# Patient Record
Sex: Female | Born: 1990 | Race: Asian | Hispanic: No | Marital: Married | Smoking: Never smoker
Health system: Southern US, Community
[De-identification: ages and names within clinical notes are randomized; demographics above are authoritative.]

## PROBLEM LIST (undated history)

## (undated) ENCOUNTER — Inpatient Hospital Stay (HOSPITAL_COMMUNITY): Payer: Self-pay

## (undated) DIAGNOSIS — J189 Pneumonia, unspecified organism: Secondary | ICD-10-CM

## (undated) DIAGNOSIS — O24419 Gestational diabetes mellitus in pregnancy, unspecified control: Secondary | ICD-10-CM

---
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
## 2010-07-25 ENCOUNTER — Inpatient Hospital Stay (HOSPITAL_COMMUNITY)
Admission: AD | Admit: 2010-07-25 | Discharge: 2010-07-25 | Payer: Self-pay | Source: Home / Self Care | Attending: Obstetrics | Admitting: Obstetrics

## 2010-07-25 LAB — URINE MICROSCOPIC-ADD ON

## 2010-07-25 LAB — URINALYSIS, ROUTINE W REFLEX MICROSCOPIC
Bilirubin Urine: NEGATIVE
Nitrite: NEGATIVE
Protein, ur: NEGATIVE mg/dL
Specific Gravity, Urine: 1.025 (ref 1.005–1.030)
Urine Glucose, Fasting: NEGATIVE mg/dL
Urobilinogen, UA: 0.2 mg/dL (ref 0.0–1.0)
pH: 6.5 (ref 5.0–8.0)

## 2010-07-25 LAB — WET PREP, GENITAL: Yeast Wet Prep HPF POC: NONE SEEN

## 2010-07-25 LAB — CBC
HCT: 29.6 % — ABNORMAL LOW (ref 36.0–46.0)
Platelets: 220 10*3/uL (ref 150–400)

## 2010-07-26 LAB — URINE CULTURE
Colony Count: NO GROWTH
Culture  Setup Time: 201201261536
Culture: NO GROWTH

## 2010-10-17 ENCOUNTER — Emergency Department (HOSPITAL_COMMUNITY)
Admission: EM | Admit: 2010-10-17 | Discharge: 2010-10-17 | Disposition: A | Discharge status: Home / Self Care | Payer: 59 | Attending: Emergency Medicine | Admitting: Emergency Medicine

## 2010-10-17 DIAGNOSIS — R197 Diarrhea, unspecified: Secondary | ICD-10-CM | POA: Insufficient documentation

## 2010-10-17 DIAGNOSIS — R112 Nausea with vomiting, unspecified: Secondary | ICD-10-CM | POA: Insufficient documentation

## 2010-10-17 DIAGNOSIS — K117 Disturbances of salivary secretion: Secondary | ICD-10-CM | POA: Insufficient documentation

## 2010-10-17 DIAGNOSIS — N390 Urinary tract infection, site not specified: Secondary | ICD-10-CM | POA: Insufficient documentation

## 2010-10-17 DIAGNOSIS — R42 Dizziness and giddiness: Secondary | ICD-10-CM | POA: Insufficient documentation

## 2010-10-17 DIAGNOSIS — R5381 Other malaise: Secondary | ICD-10-CM | POA: Insufficient documentation

## 2010-10-17 DIAGNOSIS — O2390 Unspecified genitourinary tract infection in pregnancy, unspecified trimester: Secondary | ICD-10-CM | POA: Insufficient documentation

## 2010-10-17 DIAGNOSIS — K5289 Other specified noninfective gastroenteritis and colitis: Secondary | ICD-10-CM | POA: Insufficient documentation

## 2010-10-17 DIAGNOSIS — O99891 Other specified diseases and conditions complicating pregnancy: Secondary | ICD-10-CM | POA: Insufficient documentation

## 2010-10-17 LAB — URINE MICROSCOPIC-ADD ON

## 2010-10-17 LAB — URINALYSIS, ROUTINE W REFLEX MICROSCOPIC
Bilirubin Urine: NEGATIVE
Glucose, UA: NEGATIVE mg/dL
Nitrite: NEGATIVE
Protein, ur: NEGATIVE mg/dL
Specific Gravity, Urine: 1.027 (ref 1.005–1.030)
Urobilinogen, UA: 0.2 mg/dL (ref 0.0–1.0)
pH: 6 (ref 5.0–8.0)

## 2010-10-18 LAB — URINE CULTURE

## 2010-11-12 NOTE — Discharge Summary (Signed)
[name], [name]                   ACCOUNT NO.:  [id number]   MEDICAL RECORD NO.:  [id number]          PATIENT TYPE:  INP   LOCATION:  6148                         FACILITY:  [facility]   PHYSICIAN:  Henrietta Hoover, MD    DATE OF BIRTH:  24-Oct-1990   DATE OF ADMISSION:  12/12/2007  DATE OF DISCHARGE:  12/13/2007                               DISCHARGE SUMMARY   DISCHARGE PHYSICIAN:  Henrietta Hoover, MD   REASON FOR HOSPITALIZATION:  Medication overdose.   SIGNIFICANT FINDINGS:  Gane is a 20 year old female who presented with  headache and reported that she took a Tylenol, B12, Skelaxin, Nagantec,  possibly INH, and other medications.  She was monitored overnight.  White blood cell count was 17.6, H and H was 11.5 and 32.3, platelets  were 257, MCV was 75.7, and RDW 13.1.  Chem panel showed a sodium of  136, potassium 3.8, chloride 115, CO2 of 17, BUN 5, creatinine 0.55, and  glucose 116.  Total bilirubin 0.9, total protein 6.1, AST was 16, ALT  12, albumin was 2.7, alk phos was 54, and calcium was 8.6.  EKG was  normal. Urine toxicology was negative. Salicylate level was normal.  Alcohol was negative.  Urine pregnancy was negative.  Tylenol level,  which was 74.2 at admission (12 hours post-ingestion), was down to 47.7  at 24 hours and therefore in the low risk zone.  At admission; social  work and psychology were consulted.  The patient was seen by psychology  prior to discharge and did not have suicidal ideation or a plan.  Psychology, in consulation with Lahn's family, felt that an inpatient  psychiatric admission was not needed.   TREATMENT:  Included IV fluids and Narcan.  The patient was briefly on  Mucomyst, but it was discontinued once her Tylenol was in the low risk  range.   OPERATIONS AND PROCEDURES:  None.   FINAL DIAGNOSIS:  Intentional Poly-pharmaceutcal overdose.   DISCHARGE MEDICATIONS:  Multivitamin.   PENDING RESULTS OR ISSUES TO BE FOLLOWED UP:  None.   FOLLOWUP APPOINTMENT:  Marisue Ivan, MD, [facility], phone number is [phone].  Date of appointment December 21, 2007,  at 2:50 p.m.   DISCHARGE WEIGHT:  46.823 kg.   DISCHARGE CONDITION:  Improved and stable.      Pediatrics Resident      Henrietta Hoover, MD  Electronically Signed    PR/MEDQ  D:  12/13/2007  T:  12/14/2007  Job:  [id number]

## 2010-11-22 ENCOUNTER — Inpatient Hospital Stay (HOSPITAL_COMMUNITY)
Admission: EM | Admit: 2010-11-22 | Discharge: 2010-11-25 | DRG: 775 | Disposition: A | Discharge status: Home / Self Care | Payer: 59 | Source: Ambulatory Visit | Attending: Obstetrics | Admitting: Obstetrics

## 2010-11-22 LAB — URINALYSIS, ROUTINE W REFLEX MICROSCOPIC
Bilirubin Urine: NEGATIVE
Specific Gravity, Urine: 1.015 (ref 1.005–1.030)
pH: 7 (ref 5.0–8.0)

## 2010-11-22 LAB — CBC
RBC: 4.77 MIL/uL (ref 3.87–5.11)
WBC: 11.6 10*3/uL — ABNORMAL HIGH (ref 4.0–10.5)

## 2010-11-22 LAB — URINE MICROSCOPIC-ADD ON

## 2010-11-22 LAB — RPR: RPR Ser Ql: NONREACTIVE

## 2010-11-23 ENCOUNTER — Other Ambulatory Visit: Payer: Self-pay | Admitting: Obstetrics & Gynecology

## 2010-11-24 LAB — CBC
Hemoglobin: 11.5 g/dL — ABNORMAL LOW (ref 12.0–15.0)
MCHC: 35.6 g/dL (ref 30.0–36.0)
RBC: 4.34 MIL/uL (ref 3.87–5.11)

## 2010-11-27 NOTE — H&P (Signed)
  [name], [name]                    ACCOUNT NO.:  [id number]  MEDICAL RECORD NO.:  [id number]           PATIENT TYPE:  I  LOCATION:  9161                          FACILITY:  [facility]  PHYSICIAN:  Roseanna Rainbow, M.D.DATE OF BIRTH:  04/30/1991  DATE OF ADMISSION:  11/22/2010 DATE OF DISCHARGE:                             HISTORY & PHYSICAL   CHIEF COMPLAINT:  The patient is a 20 year old para 0 with an estimated date of confinement of December 23, 2010, with an intrauterine pregnancy at 35+ weeks, complaining of contractions.  HISTORY OF PRESENT ILLNESS:  Please see the above.  ALLERGIES:  No known drug allergies.  MEDICATIONS:  Prenatal vitamins, Tylenol.  PRENATAL LABORATORY DATA:  Chlamydia probe negative.  Urine culture and sensitivity, no growth.  Pap smear negative.  GC probe negative.  Two- hour GTT normal.  Hepatitis B surface antigen negative.  Hematocrit 33.4, hemoglobin 11.2, HIV nonreactive, platelets 230,000.  Blood type is AB positive, antibody screen negative, RPR nonreactive, rubella immune.  Sickle cell negative.  PAST GYNECOLOGICAL HISTORY:  She denies.  PAST MEDICAL HISTORY:  Migraine headaches.  PAST SURGICAL HISTORY:  No previous surgeries.  SOCIAL HISTORY:  She is single, living with a significant other.  Does not give any significant history of alcohol usage, has no significant smoking history.  Denies illicit drug use.  FAMILY HISTORY:  Remarkable for hypertension.  REVIEW OF SYSTEMS:  GU:  Please see the above.  PHYSICAL EXAMINATION:  VITAL SIGNS:  Stable, afebrile.  Fetal heart tracing baseline 140, moderate long-term variability.  Accelerations present, no decelerations.  Tocodynamometer uterine contractions every 2- 4 minutes.  Sterile vaginal exam, per the mid-level provider, the cervix is 7 mc dilated with bulging bag of water.  Informal bedside ultrasound confirms the cephalic presentation.  ASSESSMENT:  Nullipara at 35+ weeks, active  labor, category I fetal heart tracing.  PLAN:  Admission.  Penicillin for GBS prophylaxis.  Monitor progress. Anticipate a spontaneous vaginal delivery.     Roseanna Rainbow, M.D.     [name]  D:  11/22/2010  T:  11/23/2010  Job:  [id number]  Electronically Signed by Antionette Char M.D. on 11/27/2010 09:48:09 PM

## 2011-03-27 LAB — POCT PREGNANCY, URINE
Operator id: 294511
Preg Test, Ur: NEGATIVE

## 2011-03-27 LAB — CBC
HCT: 32.3 — ABNORMAL LOW
Hemoglobin: 11.5 — ABNORMAL LOW
Hemoglobin: 13.4
MCHC: 35.2
MCHC: 35.5
MCV: 75.7 — ABNORMAL LOW
RBC: 4.27
RBC: 5.03
RDW: 13.1
WBC: 12.8

## 2011-03-27 LAB — DIFFERENTIAL
Basophils Relative: 1
Lymphs Abs: 3.3
Monocytes Absolute: 0.7
Monocytes Relative: 6
Neutro Abs: 8
Neutrophils Relative %: 63

## 2011-03-27 LAB — RAPID URINE DRUG SCREEN, HOSP PERFORMED
Amphetamines: NOT DETECTED
Barbiturates: NOT DETECTED
Benzodiazepines: NOT DETECTED
Tetrahydrocannabinol: NOT DETECTED

## 2011-03-27 LAB — ETHANOL: Alcohol, Ethyl (B): <5

## 2011-03-27 LAB — COMPREHENSIVE METABOLIC PANEL
Albumin: 3.7
Alkaline Phosphatase: 54
BUN: 5 — ABNORMAL LOW
Chloride: 115 — ABNORMAL HIGH
Creatinine, Ser: 0.55
Glucose, Bld: 116 — ABNORMAL HIGH
Total Bilirubin: 0.9
Total Protein: 6.1

## 2011-03-27 LAB — ACETAMINOPHEN LEVEL
Acetaminophen (Tylenol), Serum: 47.7 — ABNORMAL HIGH
Acetaminophen (Tylenol), Serum: 74.2 — ABNORMAL HIGH

## 2011-03-27 LAB — POCT I-STAT, CHEM 8
Calcium, Ion: 1.22
Glucose, Bld: 97
HCT: 37
TCO2: 17

## 2011-03-27 LAB — TRICYCLICS SCREEN, URINE: TCA Scrn: NOT DETECTED

## 2011-03-27 LAB — SALICYLATE LEVEL: Salicylate Lvl: 14.9

## 2011-03-31 LAB — POCT I-STAT, CHEM 8
BUN: 9
Calcium, Ion: 1.19
Chloride: 108
Creatinine, Ser: 0.8
Glucose, Bld: 81

## 2011-03-31 LAB — URINALYSIS, ROUTINE W REFLEX MICROSCOPIC
Bilirubin Urine: NEGATIVE
Glucose, UA: NEGATIVE
Glucose, UA: NEGATIVE
Hgb urine dipstick: NEGATIVE
Ketones, ur: NEGATIVE
Ketones, ur: 40 — AB
Protein, ur: NEGATIVE
pH: 6
pH: 6

## 2011-03-31 LAB — COMPREHENSIVE METABOLIC PANEL
ALT: 12
AST: 14
AST: 41 — ABNORMAL HIGH
Albumin: 4.7
CO2: 26
Calcium: 9.3
Calcium: 9.7
Chloride: 107
Creatinine, Ser: 0.58
Creatinine, Ser: 0.66
Glucose, Bld: 85
Sodium: 137
Total Bilirubin: 1.6 — ABNORMAL HIGH
Total Protein: 7.5

## 2011-03-31 LAB — DIFFERENTIAL
Basophils Absolute: 0.1
Eosinophils Absolute: 0.6
Eosinophils Relative: 7 — ABNORMAL HIGH
Eosinophils Relative: 1
Lymphocytes Relative: 21 — ABNORMAL LOW
Lymphocytes Relative: 29
Lymphs Abs: 3.2
Monocytes Absolute: 0.7
Monocytes Relative: 6
Neutrophils Relative %: 67

## 2011-03-31 LAB — CBC
Hemoglobin: 13.4
MCHC: 36
MCHC: 34.5
MCV: 75.9 — ABNORMAL LOW
MCV: 76.6 — ABNORMAL LOW
Platelets: 364
RBC: 4.93
WBC: 8.6

## 2011-03-31 LAB — ACETAMINOPHEN LEVEL: Acetaminophen (Tylenol), Serum: <10 — ABNORMAL LOW

## 2011-03-31 LAB — RAPID URINE DRUG SCREEN, HOSP PERFORMED
Barbiturates: NOT DETECTED
Benzodiazepines: NOT DETECTED
Cocaine: NOT DETECTED

## 2011-03-31 LAB — LIPASE, BLOOD: Lipase: 36

## 2011-03-31 LAB — TRICYCLICS SCREEN, URINE: TCA Scrn: NOT DETECTED

## 2011-03-31 LAB — SALICYLATE LEVEL: Salicylate Lvl: <4

## 2011-04-01 LAB — BASIC METABOLIC PANEL
BUN: 13
CO2: 26
Chloride: 107
Glucose, Bld: 70
Potassium: 3.9
Sodium: 140

## 2011-04-01 LAB — CBC
HCT: 39.2
Hemoglobin: 13.7
MCHC: 35
MCV: 76.2 — ABNORMAL LOW
Platelets: 289
RDW: 12.8

## 2011-04-01 LAB — URINE CULTURE
Colony Count: NO GROWTH
Culture: NO GROWTH

## 2011-04-01 LAB — DIFFERENTIAL
Basophils Absolute: 0
Eosinophils Absolute: 0.5
Eosinophils Relative: 4
Monocytes Absolute: 0.4

## 2011-04-01 LAB — URINALYSIS, ROUTINE W REFLEX MICROSCOPIC
Bilirubin Urine: NEGATIVE
Ketones, ur: 15 — AB
Nitrite: NEGATIVE
Protein, ur: NEGATIVE

## 2011-04-01 LAB — URINE MICROSCOPIC-ADD ON

## 2011-07-05 ENCOUNTER — Encounter: Payer: Self-pay | Admitting: Emergency Medicine

## 2011-07-05 ENCOUNTER — Emergency Department (INDEPENDENT_AMBULATORY_CARE_PROVIDER_SITE_OTHER)
Admission: EM | Admit: 2011-07-05 | Discharge: 2011-07-05 | Disposition: A | Discharge status: Home / Self Care | Payer: 59 | Source: Home / Self Care | Attending: Family Medicine | Admitting: Family Medicine

## 2011-07-05 DIAGNOSIS — J069 Acute upper respiratory infection, unspecified: Secondary | ICD-10-CM

## 2011-07-05 MED ORDER — AZITHROMYCIN 250 MG PO TABS: 250.0000 mg | ORAL_TABLET | Freq: Every day | ORAL | Status: AC

## 2011-07-05 MED ORDER — GUAIFENESIN-CODEINE 100-10 MG/5ML PO SYRP: ORAL_SOLUTION | ORAL | Status: DC

## 2011-07-05 NOTE — ED Provider Notes (Signed)
History     CSN: [id number]  Arrival date & time 07/05/11  1158   First MD Initiated Contact with Patient 07/05/11 1253      Chief Complaint  Patient presents with  . Cough    (Consider location/radiation/quality/duration/timing/severity/associated sxs/prior treatment) Patient is a 21 y.o. female presenting with cough. The history is provided by the patient. The history is limited by a language barrier. A language interpreter was used.  Cough This is a new problem. The current episode started more than 1 week ago. The problem occurs constantly. The problem has not changed since onset.The cough is non-productive. There has been no fever. Associated symptoms include headaches, rhinorrhea, sore throat and myalgias. Pertinent negatives include no ear pain, no shortness of breath and no wheezing. She has tried decongestants for the symptoms. The treatment provided no relief. She is not a smoker.    History reviewed. No pertinent past medical history.  History reviewed. No pertinent past surgical history.  History reviewed. No pertinent family history.  History  Substance Use Topics  . Smoking status: Never Smoker   . Smokeless tobacco: Not on file  . Alcohol Use: No    OB History    Grav Para Term Preterm Abortions TAB SAB Ect Mult Living                  Review of Systems  Constitutional: Negative.   HENT: Positive for congestion, sore throat and rhinorrhea. Negative for ear pain, trouble swallowing, neck pain, neck stiffness, postnasal drip and sinus pressure.   Respiratory: Positive for cough. Negative for chest tightness, shortness of breath and wheezing.   Gastrointestinal: Negative.   Genitourinary: Negative.   Musculoskeletal: Positive for myalgias.  Skin: Negative.   Neurological: Positive for headaches.    Allergies  Review of patient's allergies indicates no known allergies.  Home Medications   Current Outpatient Rx  Name Route Sig Dispense Refill  .  AZITHROMYCIN 250 MG PO TABS Oral Take 1 tablet (250 mg total) by mouth daily. Take first 2 tablets together, then 1 every day until finished. 6 tablet 0  . GUAIFENESIN-CODEINE 100-10 MG/5ML PO SYRP  Take 1-2 tsp po q 6 hrs prn cough 180 mL 0    BP 113/77  Pulse 100  Temp(Src) 98.6 F (37 C) (Oral)  Resp 20  SpO2 99%  LMP 06/28/2011  Physical Exam  Nursing note and vitals reviewed. Constitutional: She appears well-developed and well-nourished. No distress.  HENT:  Head: Normocephalic and atraumatic.  Right Ear: Tympanic membrane normal.  Left Ear: Tympanic membrane normal.  Nose: Nose normal.  Mouth/Throat: Uvula is midline and oropharynx is clear and moist.  Neck: Normal range of motion. Neck supple.  Cardiovascular: Normal rate, regular rhythm and normal heart sounds.   Pulmonary/Chest: Effort normal and breath sounds normal. No respiratory distress. She has no wheezes.       Constant congested cough  Lymphadenopathy:    She has no cervical adenopathy.  Skin: Skin is dry.    ED Course  Procedures (including critical care time)  Labs Reviewed - No data to display No results found.   1. URI (upper respiratory infection)       MDM          Randa Spike, MD 07/05/11 [phone]

## 2011-07-05 NOTE — ED Notes (Signed)
C/o cough, runny nose, headache, dizziness.---denies vomiting, just not eating, poor appetite.  Right side of head is very painful.

## 2013-03-06 ENCOUNTER — Emergency Department (HOSPITAL_COMMUNITY)
Admission: EM | Admit: 2013-03-06 | Discharge: 2013-03-07 | Disposition: A | Discharge status: Home / Self Care | Payer: 59 | Attending: Emergency Medicine | Admitting: Emergency Medicine

## 2013-03-06 ENCOUNTER — Encounter (HOSPITAL_COMMUNITY): Payer: Self-pay | Admitting: *Deleted

## 2013-03-06 ENCOUNTER — Emergency Department (HOSPITAL_COMMUNITY): Payer: 59

## 2013-03-06 DIAGNOSIS — N760 Acute vaginitis: Secondary | ICD-10-CM | POA: Insufficient documentation

## 2013-03-06 DIAGNOSIS — B9689 Other specified bacterial agents as the cause of diseases classified elsewhere: Secondary | ICD-10-CM | POA: Insufficient documentation

## 2013-03-06 DIAGNOSIS — O200 Threatened abortion: Secondary | ICD-10-CM

## 2013-03-06 DIAGNOSIS — O039 Complete or unspecified spontaneous abortion without complication: Secondary | ICD-10-CM | POA: Insufficient documentation

## 2013-03-06 DIAGNOSIS — A499 Bacterial infection, unspecified: Secondary | ICD-10-CM | POA: Insufficient documentation

## 2013-03-06 LAB — URINALYSIS, ROUTINE W REFLEX MICROSCOPIC
Bilirubin Urine: NEGATIVE
Hgb urine dipstick: NEGATIVE
Nitrite: NEGATIVE
Protein, ur: NEGATIVE mg/dL
Specific Gravity, Urine: 1.02 (ref 1.005–1.030)
Urobilinogen, UA: 1 mg/dL (ref 0.0–1.0)

## 2013-03-06 LAB — CBC WITH DIFFERENTIAL/PLATELET
Basophils Absolute: 0.1 10*3/uL (ref 0.0–0.1)
Basophils Relative: 0 % (ref 0–1)
Eosinophils Absolute: 0.8 10*3/uL — ABNORMAL HIGH (ref 0.0–0.7)
MCH: 26.3 pg (ref 26.0–34.0)
MCHC: 36.8 g/dL — ABNORMAL HIGH (ref 30.0–36.0)
Neutro Abs: 9.6 10*3/uL — ABNORMAL HIGH (ref 1.7–7.7)
Neutrophils Relative %: 65 % (ref 43–77)
Platelets: 252 10*3/uL (ref 150–400)
RDW: 13.4 % (ref 11.5–15.5)

## 2013-03-06 LAB — TYPE AND SCREEN: Antibody Screen: NEGATIVE

## 2013-03-06 LAB — COMPREHENSIVE METABOLIC PANEL
ALT: 7 U/L (ref 0–35)
AST: 12 U/L (ref 0–37)
Albumin: 3.8 g/dL (ref 3.5–5.2)
Alkaline Phosphatase: 33 U/L — ABNORMAL LOW (ref 39–117)
BUN: 10 mg/dL (ref 6–23)
Chloride: 101 mEq/L (ref 96–112)
Potassium: 4.1 mEq/L (ref 3.5–5.1)
Sodium: 134 mEq/L — ABNORMAL LOW (ref 135–145)
Total Protein: 7 g/dL (ref 6.0–8.3)

## 2013-03-06 LAB — URINE MICROSCOPIC-ADD ON

## 2013-03-06 NOTE — ED Notes (Addendum)
Pt states that she took a home pregnancy test 2 months ago and it came back positive. Pt states that today she wiped after using the bathroom and noticed blood. Pt states she is bleeding per normal period she would regularly have. Pt worried about miscarriage. Pt states back pain and abdominal pain since Friday.

## 2013-03-06 NOTE — ED Notes (Signed)
Patient transported to ultrasound.

## 2013-03-06 NOTE — ED Provider Notes (Signed)
CSN: [id number]     Arrival date & time 03/06/13  2057 History   First MD Initiated Contact with Patient 03/06/13 2147     Chief Complaint  Patient presents with  . Vaginal Bleeding    possibly 2 months pregnant   (Consider location/radiation/quality/duration/timing/severity/associated sxs/prior Treatment) Patient is a 22 y.o. female presenting with vaginal bleeding. The history is provided by the patient and medical records.  Vaginal Bleeding Associated symptoms: abdominal pain    Patient presents to the ED for abdominal pain and vaginal bleeding. Patient is G2 P1, approximately [redacted] weeks gestation who noticed a small amount of vaginal bleeding earlier today after urinating. Patient states this amount of blood resembled her normal period, no clots. Abdominal pain described as cramping in nature. No associated nausea, vomiting, or diarrhea.  Admits to some dysuria 1 week ago, none at present.  No vaginal discharge.  No new sexual partners or concern for STD.  Patient has not received any prenatal care at this time.  History reviewed. No pertinent past medical history. History reviewed. No pertinent past surgical history. History reviewed. No pertinent family history. History  Substance Use Topics  . Smoking status: Never Smoker   . Smokeless tobacco: Not on file  . Alcohol Use: No   OB History   Grav Para Term Preterm Abortions TAB SAB Ect Mult Living                 Review of Systems  Gastrointestinal: Positive for abdominal pain.  Genitourinary: Positive for vaginal bleeding.  All other systems reviewed and are negative.    Allergies  Review of patient's allergies indicates no known allergies.  Home Medications  No current outpatient prescriptions on file. BP 122/80  Pulse 94  Temp(Src) 98.7 F (37.1 C) (Oral)  Resp 16  SpO2 100%  Physical Exam  Nursing note and vitals reviewed. Constitutional: She is oriented to person, place, and time. She appears well-developed and  well-nourished.  HENT:  Head: Normocephalic and atraumatic.  Mouth/Throat: Oropharynx is clear and moist.  Eyes: Conjunctivae and EOM are normal. Pupils are equal, round, and reactive to light.  Neck: Normal range of motion.  Cardiovascular: Normal rate, regular rhythm and normal heart sounds.   Pulmonary/Chest: Effort normal and breath sounds normal.  Abdominal: Soft. Bowel sounds are normal.  Genitourinary: Vagina normal. There is no lesion on the right labia. There is no lesion on the left labia. Cervix exhibits no motion tenderness. Right adnexum displays no tenderness. Left adnexum displays no tenderness. No bleeding around the vagina. No vaginal discharge found.  Cervical os closed, no vaginal bleeding; scant purulent vaginal d/c; no adnexal or CMT  Musculoskeletal: Normal range of motion.  Neurological: She is alert and oriented to person, place, and time.  Skin: Skin is warm and dry.  Psychiatric: She has a normal mood and affect.    ED Course  Procedures (including critical care time) Labs Review Labs Reviewed  URINALYSIS, ROUTINE W REFLEX MICROSCOPIC - Abnormal; Notable for the following:    Leukocytes, UA SMALL (*)    All other components within normal limits  URINE MICROSCOPIC-ADD ON - Abnormal; Notable for the following:    Squamous Epithelial / LPF FEW (*)    All other components within normal limits  CBC WITH DIFFERENTIAL - Abnormal; Notable for the following:    WBC 14.8 (*)    Hemoglobin 11.7 (*)    HCT 31.8 (*)    MCV 71.5 (*)    MCHC  36.8 (*)    Neutro Abs 9.6 (*)    Monocytes Absolute 1.1 (*)    Eosinophils Absolute 0.8 (*)    All other components within normal limits  COMPREHENSIVE METABOLIC PANEL - Abnormal; Notable for the following:    Sodium 134 (*)    Alkaline Phosphatase 33 (*)    All other components within normal limits  HCG, QUANTITATIVE, PREGNANCY - Abnormal; Notable for the following:    hCG, Beta Chain, Quant, [place] [address] (*)    All other  components within normal limits  POCT PREGNANCY, URINE - Abnormal; Notable for the following:    Preg Test, Ur POSITIVE (*)    All other components within normal limits  URINE CULTURE  WET PREP, GENITAL  GC/CHLAMYDIA PROBE AMP  TYPE AND SCREEN  ABO/RH   Imaging Review US Ob Comp Less 14 Wks  03/06/2013   *RADIOLOGY REPORT*  Clinical Data: Vaginal bleeding, clinical gestational age of [redacted] weeks and 2 days.  OBSTETRIC <14 WK ULTRASOUND, TRANSVAGINAL OB US  Technique:  Transabdominal and transvaginal ultrasound was performed for evaluation of the gestation as well as the maternal uterus and adnexal regions.  Number of gestation: 1 Heart Rate: 187 bpm  CRL:  22.5         8w  6d                  [place] EDC: 10/10/2013  Maternal uterus/adnexae: No subchorionic hemorrhage.  The right ovary appears normal measuring 2 x 1.1 x 2.7 cm.  The left ovary measures 3.6 x 8.3 x 4.3 cm.  Cyst within the left ovary measures 2.7 x 2.7 x 2.8 cm.  No free fluid.  IMPRESSION:  1.  Single living intrauterine gestation without complication. 2.  The estimated gestational age is 8 weeks and 6 days.  The clinical gestational age is 9 weeks and 2 days. 3.  Cyst in left ovary.   Original Report Authenticated By: Signa Kell, M.D.   US Ob Transvaginal  03/06/2013   *RADIOLOGY REPORT*  Clinical Data: Vaginal bleeding, clinical gestational age of [redacted] weeks and 2 days.  OBSTETRIC <14 WK ULTRASOUND, TRANSVAGINAL OB US  Technique:  Transabdominal and transvaginal ultrasound was performed for evaluation of the gestation as well as the maternal uterus and adnexal regions.  Number of gestation: 1 Heart Rate: 187 bpm  CRL:  22.5         8w  6d                  [place] EDC: 10/10/2013  Maternal uterus/adnexae: No subchorionic hemorrhage.  The right ovary appears normal measuring 2 x 1.1 x 2.7 cm.  The left ovary measures 3.6 x 8.3 x 4.3 cm.  Cyst within the left ovary measures 2.7 x 2.7 x 2.8 cm.  No free fluid.  IMPRESSION:  1.  Single living  intrauterine gestation without complication. 2.  The estimated gestational age is 8 weeks and 6 days.  The clinical gestational age is 9 weeks and 2 days. 3.  Cyst in left ovary.   Original Report Authenticated By: Signa Kell, M.D.    MDM   1. Threatened abortion in first trimester   2. Bacterial vaginosis    Vaginal bleeding in first trimester.  Labs as above.  Blood type AB +, Rhogam not indicated.  U/S with single, intrauterine pregnancy estimated at 8 weeks 6 days, no subchorionic hemorrhage.  Quant appropriate for gestational age. Pelvic exam without noted vaginal bleeding.  Wet prep with many clue cells, Gc/Chl pending.  I doubt acute/surgical abdomen at this time.  Pt afebrile, non-toxic appearing, NAD, VS stable- ok for discharge.  Rx flagyl and prenatal vitamins.  Instructed pt to FU with OB-GYN.  Garlon Hatchet, PA-C 03/07/13 [phone]

## 2013-03-07 LAB — WET PREP, GENITAL: Yeast Wet Prep HPF POC: NONE SEEN

## 2013-03-07 LAB — GC/CHLAMYDIA PROBE AMP
CT Probe RNA: NEGATIVE
GC Probe RNA: NEGATIVE

## 2013-03-07 LAB — OB RESULTS CONSOLE GC/CHLAMYDIA
Chlamydia: NEGATIVE
GC PROBE AMP, GENITAL: NEGATIVE

## 2013-03-07 MED ORDER — METRONIDAZOLE 500 MG PO TABS: 500.0000 mg | ORAL_TABLET | Freq: Two times a day (BID) | ORAL | Status: DC

## 2013-03-07 MED ORDER — PRENATAL COMPLETE 14-0.4 MG PO TABS: 1.0000 | ORAL_TABLET | Freq: Every day | ORAL | Status: DC

## 2013-03-07 NOTE — ED Provider Notes (Signed)
Medical screening examination/treatment/procedure(s) were performed by non-physician practitioner and as supervising physician I was immediately available for consultation/collaboration.  Jermone Geister, MD 03/07/13 0106 

## 2013-03-08 LAB — URINE CULTURE: Colony Count: NO GROWTH

## 2013-04-15 ENCOUNTER — Ambulatory Visit (INDEPENDENT_AMBULATORY_CARE_PROVIDER_SITE_OTHER): Payer: 59 | Admitting: Advanced Practice Midwife

## 2013-04-15 ENCOUNTER — Encounter: Payer: Self-pay | Admitting: Advanced Practice Midwife

## 2013-04-15 ENCOUNTER — Other Ambulatory Visit: Payer: Self-pay | Admitting: Advanced Practice Midwife

## 2013-04-15 VITALS — BP 114/73 | Temp 98.1°F | Ht <= 58 in | Wt 110.6 lb

## 2013-04-15 DIAGNOSIS — Z3480 Encounter for supervision of other normal pregnancy, unspecified trimester: Secondary | ICD-10-CM

## 2013-04-15 DIAGNOSIS — Z3689 Encounter for other specified antenatal screening: Secondary | ICD-10-CM

## 2013-04-15 DIAGNOSIS — O09292 Supervision of pregnancy with other poor reproductive or obstetric history, second trimester: Secondary | ICD-10-CM

## 2013-04-15 DIAGNOSIS — Z3201 Encounter for pregnancy test, result positive: Secondary | ICD-10-CM

## 2013-04-15 DIAGNOSIS — O09219 Supervision of pregnancy with history of pre-term labor, unspecified trimester: Secondary | ICD-10-CM

## 2013-04-15 NOTE — Progress Notes (Signed)
Pulse 101 Subjective:    [name] is being seen today for her first obstetrical visit.  This is not a planned pregnancy. She is at [redacted]w[redacted]d gestation. Her obstetrical history is significant for preterm labor and delivery. Relationship with FOB: significant other, living together. Patient does intend to breast feed. Pregnancy history fully reviewed.  Menstrual History: OB History   Grav Para Term Preterm Abortions TAB SAB Ect Mult Living   2 1  1      1       Menarche age: 78  Patient's last menstrual period was 12/31/2012.    The following portions of the patient's history were reviewed and updated as appropriate: allergies, current medications, past family history, past medical history, past social history, past surgical history and problem list.  Review of Systems A comprehensive review of systems was negative.    Objective:    BP 114/73  Temp(Src) 98.1 F (36.7 C)  Ht 4\' 10"  (1.473 m)  Wt 110 lb 9.6 oz (50.168 kg)  BMI 23.12 kg/m2  LMP 12/31/2012  General Appearance:    Alert, cooperative, no distress, appears stated age  Head:    Normocephalic, without obvious abnormality, atraumatic  Eyes:    PERRL, conjunctiva/corneas clear, EOM's intact, fundi    benign, both eyes  Ears:    Normal TM's and external ear canals, both ears  Nose:   Nares normal, septum midline, mucosa normal, no drainage    or sinus tenderness  Throat:   Lips, mucosa, and tongue normal; teeth and gums normal  Neck:   Supple, symmetrical, trachea midline, no adenopathy;    thyroid:  no enlargement/tenderness/nodules; no carotid   bruit or JVD  Back:     Symmetric, no curvature, ROM normal, no CVA tenderness  Lungs:     Clear to auscultation bilaterally, respirations unlabored  Chest Wall:    No tenderness or deformity   Heart:    Regular rate and rhythm, S1 and S2 normal, no murmur, rub   or gallop  Breast Exam:    No tenderness, masses, or nipple abnormality  Abdomen:     Soft, non-tender, bowel sounds  active all four quadrants,    no masses, no organomegaly  Genitalia:    Normal female without lesion, discharge or tenderness  Rectal:    Normal tone, normal prostate, no masses or tenderness;   guaiac negative stool  Extremities:   Extremities normal, atraumatic, no cyanosis or edema  Pulses:   2+ and symmetric all extremities  Skin:   Skin color, texture, turgor normal, no rashes or lesions  Lymph nodes:   Cervical, supraclavicular, and axillary nodes normal  Neurologic:   CNII-XII intact, normal strength, sensation and reflexes    throughout      Assessment:    Pregnancy at [redacted]w[redacted]d weeks  Patient Active Problem List   Diagnosis Date Noted  . H/O preterm delivery, currently pregnant 04/15/2013  . 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 Prenatal vitamins.  Counseling provided regarding continued use of seat belts, cessation of alcohol consumption, smoking or use of illicit drugs; infection precautions i.e., influenza/TDAP immunizations, toxoplasmosis,CMV, parvovirus, listeria and varicella; workplace safety, exercise during pregnancy; routine dental care, safe medications, sexual activity, hot tubs, saunas, pools, travel, caffeine use, fish and methlymercury, potential toxins, hair treatments, varicose veins Weight gain recommendations reviewed: underweight/BMI< 18.5--> gain 28 - 40 lbs; normal weight/BMI 18.5 - 24.9--> gain 25 - 35 lbs; overweight/BMI  25 - 29.9--> gain 15 - 25 lbs; obese/BMI >30->gain  11 - 20 lbs Problem list reviewed and updated. AFP3 discussed: requested. Role of ultrasound in pregnancy discussed; fetal survey: requested. Amniocentesis discussed: not indicated. Follow up in 4 weeks. 90% of 40 min visit spent on counseling and coordination of care.  MFM consult for history of Preterm delivery.  Lavanna Rog Wilson Singer CNM

## 2013-04-16 LAB — OBSTETRIC PANEL
Basophils Absolute: 0.1 10*3/uL (ref 0.0–0.1)
Basophils Relative: 1 % (ref 0–1)
Hemoglobin: 12.5 g/dL (ref 12.0–15.0)
Hepatitis B Surface Ag: NEGATIVE
MCHC: 34.8 g/dL (ref 30.0–36.0)
Neutro Abs: 9.6 10*3/uL — ABNORMAL HIGH (ref 1.7–7.7)
Neutrophils Relative %: 69 % (ref 43–77)
RDW: 13.3 % (ref 11.5–15.5)
WBC: 13.6 10*3/uL — ABNORMAL HIGH (ref 4.0–10.5)

## 2013-04-16 LAB — VITAMIN D 25 HYDROXY (VIT D DEFICIENCY, FRACTURES): Vit D, 25-Hydroxy: 18 ng/mL — ABNORMAL LOW (ref 30–89)

## 2013-04-16 LAB — VARICELLA ZOSTER ANTIBODY, IGG: Varicella IgG: 271 Index — ABNORMAL HIGH (ref <135.00)

## 2013-04-16 LAB — HIV ANTIBODY (ROUTINE TESTING W REFLEX): HIV: NONREACTIVE

## 2013-04-18 LAB — PAP IG, CT-NG, RFX HPV ASCU
Chlamydia Probe Amp: NEGATIVE
GC Probe Amp: NEGATIVE

## 2013-04-19 LAB — HEMOGLOBINOPATHY EVALUATION
Hgb F Quant: 3.9 % — ABNORMAL HIGH (ref 0.0–2.0)
Hgb S Quant: 0 %

## 2013-04-20 LAB — AFP, QUAD SCREEN
AFP: 19.6 IU/mL
Age Alone: 1:1140 {titer}
Curr Gest Age: 14.4 wks.days
Down Syndrome Scr Risk Est: 1:25800 {titer}
HCG, Total: 29062 m[IU]/mL
INH: 97.8 pg/mL
MoM for INH: 0.46
MoM for hCG: 0.77
Open Spina bifida: NEGATIVE
Osb Risk: <1:27300 {titer}
Trisomy 18 (Edward) Syndrome Interp.: 1:26400 {titer}

## 2013-04-22 LAB — HGB ELECTROPHORESIS REFLEXED REPORT
Hemoglobin A - HGBRFX: 67.1 % — ABNORMAL LOW (ref >96.0)
Hemoglobin E: 26.2 % — ABNORMAL HIGH
Hemoglobin F - HGBRFX: 3.6 % — ABNORMAL HIGH (ref <2.0)

## 2013-04-25 ENCOUNTER — Encounter: Payer: Self-pay | Admitting: Advanced Practice Midwife

## 2013-04-25 DIAGNOSIS — D582 Other hemoglobinopathies: Secondary | ICD-10-CM | POA: Insufficient documentation

## 2013-04-25 DIAGNOSIS — E559 Vitamin D deficiency, unspecified: Secondary | ICD-10-CM | POA: Insufficient documentation

## 2013-05-06 ENCOUNTER — Other Ambulatory Visit (HOSPITAL_COMMUNITY): Payer: 59

## 2013-05-10 ENCOUNTER — Ambulatory Visit (HOSPITAL_COMMUNITY): Admission: RE | Admit: 2013-05-10 | Payer: 59 | Source: Ambulatory Visit

## 2013-05-10 ENCOUNTER — Ambulatory Visit (HOSPITAL_COMMUNITY): Payer: 59 | Attending: Advanced Practice Midwife

## 2013-05-13 ENCOUNTER — Ambulatory Visit (INDEPENDENT_AMBULATORY_CARE_PROVIDER_SITE_OTHER): Payer: 59 | Admitting: Advanced Practice Midwife

## 2013-05-13 VITALS — BP 106/71 | Temp 98.2°F | Wt 115.8 lb

## 2013-05-13 DIAGNOSIS — E559 Vitamin D deficiency, unspecified: Secondary | ICD-10-CM

## 2013-05-13 DIAGNOSIS — Z3480 Encounter for supervision of other normal pregnancy, unspecified trimester: Secondary | ICD-10-CM

## 2013-05-13 DIAGNOSIS — Z3482 Encounter for supervision of other normal pregnancy, second trimester: Secondary | ICD-10-CM

## 2013-05-13 LAB — POCT URINALYSIS DIPSTICK
Bilirubin, UA: NEGATIVE
Glucose, UA: NEGATIVE
Ketones, UA: NEGATIVE
Nitrite, UA: NEGATIVE

## 2013-05-13 MED ORDER — OB COMPLETE PETITE 35-5-1-200 MG PO CAPS: 1.0000 | ORAL_CAPSULE | Freq: Every day | ORAL | Status: DC

## 2013-05-13 NOTE — Progress Notes (Signed)
Pulse = 112 

## 2013-05-13 NOTE — Progress Notes (Signed)
Routine Obstetrical Visit  Subjective:    [name] is being seen today for her routine obstetrical visit. She is at [redacted]w[redacted]d gestation.   Patient not aware of [place] and MFM appt.  Patient reports no complaints.   Objective:     BP 106/71  Temp(Src) 98.2 F (36.8 C)  Wt 115 lb 12.8 oz (52.527 kg)  LMP 12/31/2012 Physical Exam  Exam FHR 150 FH @ U   Assessment:    Pregnancy: G2P0101 Patient Active Problem List   Diagnosis Date Noted  . 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 Unspecified vitamin D deficiency 04/25/2013  . H/O preterm delivery, currently pregnant 04/15/2013  . 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 Problem list reviewed and updated. MFM [place] and referral rescheduled, patient missed 1st appt was not aware of it.  Follow up in 4 weeks. GCT plan after next visit or do 1 hour gct. Gave patient PNV w/ Vitamin D  80% of 20 min visit spent on counseling and coordination of care.     Janee Ureste 05/13/2013

## 2013-05-24 ENCOUNTER — Ambulatory Visit (HOSPITAL_COMMUNITY)
Admission: RE | Admit: 2013-05-24 | Discharge: 2013-05-24 | Disposition: A | Discharge status: Home / Self Care | Payer: 59 | Source: Ambulatory Visit | Attending: Advanced Practice Midwife | Admitting: Advanced Practice Midwife

## 2013-05-24 ENCOUNTER — Encounter (HOSPITAL_COMMUNITY): Payer: Self-pay

## 2013-05-24 VITALS — BP 120/70 | HR 110 | Wt 116.0 lb

## 2013-05-24 DIAGNOSIS — Z1389 Encounter for screening for other disorder: Secondary | ICD-10-CM | POA: Insufficient documentation

## 2013-05-24 DIAGNOSIS — E559 Vitamin D deficiency, unspecified: Secondary | ICD-10-CM

## 2013-05-24 DIAGNOSIS — O358XX0 Maternal care for other (suspected) fetal abnormality and damage, not applicable or unspecified: Secondary | ICD-10-CM | POA: Diagnosis present

## 2013-05-24 DIAGNOSIS — Z363 Encounter for antenatal screening for malformations: Secondary | ICD-10-CM | POA: Insufficient documentation

## 2013-05-24 DIAGNOSIS — O09292 Supervision of pregnancy with other poor reproductive or obstetric history, second trimester: Secondary | ICD-10-CM

## 2013-05-24 DIAGNOSIS — Z8751 Personal history of pre-term labor: Secondary | ICD-10-CM | POA: Diagnosis not present

## 2013-05-24 DIAGNOSIS — D582 Other hemoglobinopathies: Secondary | ICD-10-CM

## 2013-05-24 DIAGNOSIS — Z3689 Encounter for other specified antenatal screening: Secondary | ICD-10-CM

## 2013-05-24 NOTE — ED Notes (Signed)
Patient unsure of fetal movement.

## 2013-05-24 NOTE — Consult Note (Signed)
Maternal Fetal Medicine Consultation  Requesting Provider(s): Amy Dessa Phi, CNM  Reason for consultation: History of preterm delivery at [redacted] weeks gestation  HPI: [name] is a 22 yo G2P0101 currently at 69 4/7 weeks who was seen for consultation due to history of a previous spontaneous preterm delivery at 35 weeks.  She reports that she was admitted on several occasions during that pregnancy for preterm contractions.  She denies PROM with that pregnancy.  Her prenatal course has otherwise been uncomplicated.  She is without complaints today.  OB History: OB History   Grav Para Term Preterm Abortions TAB SAB Ect Mult Living   2 1  1      1       PMH: Hemoglobin E trait (benign)  PSH:  Past Surgical History  Procedure Laterality Date  . Vaginal delivery     Meds: Prenatal vitamins  Allergies: No Known Allergies  FH: Hypertension - mother; denies family history of birth defects or hereditary disorders  Soc: denies tobacco, ETOH or illicit drug use  Review of Systems: no vaginal bleeding or cramping/contractions, no LOF, no nausea/vomiting. All other systems reviewed and are negative.  PE:   Filed Vitals:   05/24/13 1319  BP: 120/70  Pulse: 110   Please see separate document for fetal ultrasound report.   A/P: 1) Single IUP at 20 4/7 weeks         2) Hx of previous preterm delivery at 35-36 weeks - based on this information, the patient is a candidate for 17-P injections.  This intervention my decrease the risk of recurrent preterm delivery at ~ 30%.  In general, would start medications at 16-20 weeks (or as close to 20 weeks as possible) and continue weekly injections until [redacted] weeks gestation.  Questions were answered to the patient's satisfaction - but she is still somewhat hesitant.  She will discuss further at her next OB follow up.   Thank you for the opportunity to be a part of the care of [name]. Please contact our office if we can be of further assistance.    I spent approximately 15 minutes with this patient with over 50% of time spent in face-to-face counseling.  Alpha Gula, MD Maternal-Fetal Medicine

## 2013-05-25 ENCOUNTER — Encounter: Payer: Self-pay | Admitting: Advanced Practice Midwife

## 2013-05-25 LAB — US OB DETAIL + 14 WK

## 2013-06-10 ENCOUNTER — Encounter: Payer: 59 | Admitting: Advanced Practice Midwife

## 2013-06-10 ENCOUNTER — Ambulatory Visit (INDEPENDENT_AMBULATORY_CARE_PROVIDER_SITE_OTHER): Payer: 59 | Admitting: Obstetrics & Gynecology

## 2013-06-10 VITALS — BP 125/68 | Temp 98.3°F | Wt 124.0 lb

## 2013-06-10 DIAGNOSIS — Z3480 Encounter for supervision of other normal pregnancy, unspecified trimester: Secondary | ICD-10-CM | POA: Insufficient documentation

## 2013-06-10 DIAGNOSIS — Z3482 Encounter for supervision of other normal pregnancy, second trimester: Secondary | ICD-10-CM

## 2013-06-10 DIAGNOSIS — Z23 Encounter for immunization: Secondary | ICD-10-CM

## 2013-06-10 LAB — POCT URINALYSIS DIPSTICK
Leukocytes, UA: NEGATIVE
Nitrite, UA: NEGATIVE
Protein, UA: NEGATIVE
pH, UA: 6

## 2013-06-10 NOTE — Patient Instructions (Signed)
Second Trimester of Pregnancy The second trimester is from week 13 through week 28, months 4 through 6. The second trimester is often a time when you feel your best. Your body has also adjusted to being pregnant, and you begin to feel better physically. Usually, morning sickness has lessened or quit completely, you may have more energy, and you may have an increase in appetite. The second trimester is also a time when the fetus is growing rapidly. At the end of the sixth month, the fetus is about 9 inches long and weighs about 1 pounds. You will likely begin to feel the baby move (quickening) between 18 and 20 weeks of the pregnancy. BODY CHANGES Your body goes through many changes during pregnancy. The changes vary from woman to woman.   Your weight will continue to increase. You will notice your lower abdomen bulging out.  You may begin to get stretch marks on your hips, abdomen, and breasts.  You may develop headaches that can be relieved by medicines approved by your caregiver.  You may urinate more often because the fetus is pressing on your bladder.  You may develop or continue to have heartburn as a result of your pregnancy.  You may develop constipation because certain hormones are causing the muscles that push waste through your intestines to slow down.  You may develop hemorrhoids or swollen, bulging veins (varicose veins).  You may have back pain because of the weight gain and pregnancy hormones relaxing your joints between the bones in your pelvis and as a result of a shift in weight and the muscles that support your balance.  Your breasts will continue to grow and be tender.  Your gums may bleed and may be sensitive to brushing and flossing.  Dark spots or blotches (chloasma, mask of pregnancy) may develop on your face. This will likely fade after the baby is born.  A dark line from your belly button to the pubic area (linea nigra) may appear. This will likely fade after the  baby is born. WHAT TO EXPECT AT YOUR PRENATAL VISITS During a routine prenatal visit:  You will be weighed to make sure you and the fetus are growing normally.  Your blood pressure will be taken.  Your abdomen will be measured to track your baby's growth.  The fetal heartbeat will be listened to.  Any test results from the previous visit will be discussed. Your caregiver may ask you:  How you are feeling.  If you are feeling the baby move.  If you have had any abnormal symptoms, such as leaking fluid, bleeding, severe headaches, or abdominal cramping.  If you have any questions. Other tests that may be performed during your second trimester include:  Blood tests that check for:  Low iron levels (anemia).  Gestational diabetes (between 24 and 28 weeks).  Rh antibodies.  Urine tests to check for infections, diabetes, or protein in the urine.  An ultrasound to confirm the proper growth and development of the baby.  An amniocentesis to check for possible genetic problems.  Fetal screens for spina bifida and Down syndrome. HOME CARE INSTRUCTIONS   Avoid all smoking, herbs, alcohol, and unprescribed drugs. These chemicals affect the formation and growth of the baby.  Follow your caregiver's instructions regarding medicine use. There are medicines that are either safe or unsafe to take during pregnancy.  Exercise only as directed by your caregiver. Experiencing uterine cramps is a good sign to stop exercising.  Continue to eat regular,   healthy meals.  Wear a good support bra for breast tenderness.  Do not use hot tubs, steam rooms, or saunas.  Wear your seat belt at all times when driving.  Avoid raw meat, uncooked cheese, cat litter boxes, and soil used by cats. These carry germs that can cause birth defects in the baby.  Take your prenatal vitamins.  Try taking a stool softener (if your caregiver approves) if you develop constipation. Eat more high-fiber foods,  such as fresh vegetables or fruit and whole grains. Drink plenty of fluids to keep your urine clear or pale yellow.  Take warm sitz baths to soothe any pain or discomfort caused by hemorrhoids. Use hemorrhoid cream if your caregiver approves.  If you develop varicose veins, wear support hose. Elevate your feet for 15 minutes, 3 4 times a day. Limit salt in your diet.  Avoid heavy lifting, wear low heel shoes, and practice good posture.  Rest with your legs elevated if you have leg cramps or low back pain.  Visit your dentist if you have not gone yet during your pregnancy. Use a soft toothbrush to brush your teeth and be gentle when you floss.  A sexual relationship may be continued unless your caregiver directs you otherwise.  Continue to go to all your prenatal visits as directed by your caregiver. SEEK MEDICAL CARE IF:   You have dizziness.  You have mild pelvic cramps, pelvic pressure, or nagging pain in the abdominal area.  You have persistent nausea, vomiting, or diarrhea.  You have a bad smelling vaginal discharge.  You have pain with urination. SEEK IMMEDIATE MEDICAL CARE IF:   You have a fever.  You are leaking fluid from your vagina.  You have spotting or bleeding from your vagina.  You have severe abdominal cramping or pain.  You have rapid weight gain or loss.  You have shortness of breath with chest pain.  You notice sudden or extreme swelling of your face, hands, ankles, feet, or legs.  You have not felt your baby move in over an hour.  You have severe headaches that do not go away with medicine.  You have vision changes. Document Released: 06/10/2001 Document Revised: 02/16/2013 Document Reviewed: 08/17/2012 Exit[employer] Patient Information 2014 [employer].  

## 2013-06-10 NOTE — Progress Notes (Signed)
HR - 102  Pt in office for routine OB visit, denies concerns at this time

## 2013-06-13 ENCOUNTER — Encounter: Payer: Self-pay | Admitting: Obstetrics & Gynecology

## 2013-06-30 NOTE — L&D Delivery Note (Signed)
Delivery Note At 10:50 AM a viable female was delivered via Vaginal, Spontaneous Delivery (Presentation: LOA).  APGAR: 7, 9.   Loose nuchal cord Placenta status: Intact, Spontaneous via Tomasa BlaseSchultz.  Cord: 3 vessels with the following complications: None.    Anesthesia: Epidural  Episiotomy: None Lacerations: 1st degree Suture Repair: 3.0 vicryl rapide Est. Blood Loss (mL): 200 ml  Mom to postpartum.    JACKSON-MOORE,Zacharias Ridling A 09/21/2013, 11:24 AM

## 2013-07-03 ENCOUNTER — Emergency Department (HOSPITAL_COMMUNITY): Admission: EM | Admit: 2013-07-03 | Discharge: 2013-07-03 | Discharge status: Against Medical Advice | Payer: 59

## 2013-07-03 ENCOUNTER — Inpatient Hospital Stay (HOSPITAL_COMMUNITY)
Admission: AD | Admit: 2013-07-03 | Discharge: 2013-07-04 | Disposition: A | Discharge status: Home / Self Care | Payer: 59 | Source: Ambulatory Visit | Attending: Obstetrics | Admitting: Obstetrics

## 2013-07-03 DIAGNOSIS — R05 Cough: Secondary | ICD-10-CM | POA: Diagnosis present

## 2013-07-03 DIAGNOSIS — O9989 Other specified diseases and conditions complicating pregnancy, childbirth and the puerperium: Principal | ICD-10-CM

## 2013-07-03 DIAGNOSIS — O99512 Diseases of the respiratory system complicating pregnancy, second trimester: Secondary | ICD-10-CM

## 2013-07-03 DIAGNOSIS — J3489 Other specified disorders of nose and nasal sinuses: Secondary | ICD-10-CM | POA: Diagnosis not present

## 2013-07-03 DIAGNOSIS — R059 Cough, unspecified: Secondary | ICD-10-CM | POA: Diagnosis present

## 2013-07-03 DIAGNOSIS — J189 Pneumonia, unspecified organism: Secondary | ICD-10-CM | POA: Diagnosis not present

## 2013-07-03 DIAGNOSIS — O99891 Other specified diseases and conditions complicating pregnancy: Secondary | ICD-10-CM | POA: Insufficient documentation

## 2013-07-03 NOTE — MAU Note (Signed)
Pt c/o cough, sneezing, sore throat, fever/chills and HA for several days. Denies n/v/d. No other complaints.

## 2013-07-04 ENCOUNTER — Encounter (HOSPITAL_COMMUNITY): Payer: Self-pay

## 2013-07-04 DIAGNOSIS — O99891 Other specified diseases and conditions complicating pregnancy: Secondary | ICD-10-CM

## 2013-07-04 DIAGNOSIS — O9989 Other specified diseases and conditions complicating pregnancy, childbirth and the puerperium: Principal | ICD-10-CM

## 2013-07-04 LAB — URINALYSIS, ROUTINE W REFLEX MICROSCOPIC
Bilirubin Urine: NEGATIVE
Glucose, UA: NEGATIVE mg/dL
Ketones, ur: NEGATIVE mg/dL
Nitrite: NEGATIVE
PH: 6 (ref 5.0–8.0)
Protein, ur: NEGATIVE mg/dL
Urobilinogen, UA: 0.2 mg/dL (ref 0.0–1.0)

## 2013-07-04 LAB — URINE MICROSCOPIC-ADD ON

## 2013-07-04 MED ORDER — GUAIFENESIN 100 MG/5ML PO SYRP
200.0000 mg | ORAL_SOLUTION | Freq: Once | ORAL | Status: AC
  Administered 2013-07-04: 200 mg via ORAL
  Filled 2013-07-04: qty 10

## 2013-07-04 MED ORDER — ACETAMINOPHEN 500 MG PO TABS
1000.0000 mg | ORAL_TABLET | Freq: Once | ORAL | Status: AC
  Administered 2013-07-04: 1000 mg via ORAL
  Filled 2013-07-04: qty 2

## 2013-07-04 MED ORDER — AZITHROMYCIN 250 MG PO TABS
500.0000 mg | ORAL_TABLET | Freq: Once | ORAL | Status: AC
  Administered 2013-07-04: 500 mg via ORAL
  Filled 2013-07-04: qty 2

## 2013-07-04 MED ORDER — AZITHROMYCIN 250 MG PO TABS: ORAL_TABLET | ORAL | Status: DC

## 2013-07-04 NOTE — MAU Provider Note (Signed)
  History     CSN: [id number]  Arrival date and time: 07/03/13 2330   First Provider Initiated Contact with Patient 07/04/13 0007      Chief Complaint  Patient presents with  . Influenza   Influenza    Rockwell GermanyLan H Dambach is a 23 y.o. G2P0101 at 5943w3d who presents today with a cough and congestion. She states that she has had the cough for 3 weeks. She has not taken anything for it. She denies any fever, and states that now she also has nasal congestion in addition to the cough. She denies any contractions, VB or LOF and confirms fetal movement.  History reviewed. No pertinent past medical history.  Past Surgical History  Procedure Laterality Date  . Vaginal delivery      Family History  Problem Relation Age of Onset  . Heart disease Neg Hx   . Diabetes Neg Hx   . Cancer Neg Hx   . Hypertension Mother     History  Substance Use Topics  . Smoking status: Never Smoker   . Smokeless tobacco: Never Used  . Alcohol Use: No    Allergies: No Known Allergies  Prescriptions prior to admission  Medication Sig Dispense Refill  . acetaminophen (TYLENOL) 500 MG tablet Take 500 mg by mouth every 6 (six) hours as needed for pain.      . Prenat-FeCbn-FeAspGl-FA-Omega (OB COMPLETE PETITE) 35-5-1-200 MG CAPS Take 1 tablet by mouth daily.  30 capsule  9    ROS Physical Exam   Blood pressure 129/73, pulse 114, temperature 98.5 F (36.9 C), temperature source Oral, resp. rate 18, height 4' 8.1" (1.425 m), weight 59.966 kg (132 lb 3.2 oz), last menstrual period 12/31/2012, SpO2 97.00%.  Physical Exam  Nursing note and vitals reviewed. Constitutional: She is oriented to person, place, and time. She appears well-developed and well-nourished. No distress.  Cardiovascular: Normal rate.   Respiratory: Effort normal. No respiratory distress. She has no wheezes. She has rales.  Neurological: She is alert and oriented to person, place, and time.  Skin: Skin is warm and dry.  Psychiatric: She has  a normal mood and affect.  FHT: 140, moderate with GA appropriate accels, no decels Toco: no UCs MAU Course  Procedures    Assessment and Plan   1. Pneumonia complicating pregnancy in second trimester       Medication List         acetaminophen 500 MG tablet  Commonly known as:  TYLENOL  Take 500 mg by mouth every 6 (six) hours as needed for pain.     azithromycin 250 MG tablet  Commonly known as:  ZITHROMAX  Take one each day for 4 days     OB COMPLETE PETITE 35-5-1-200 MG Caps  Take 1 tablet by mouth daily.       Follow-up Information   Follow up with HARPER,CHARLES A, MD. (as scheduled )    Specialty:  Obstetrics and Gynecology   Contact information:   [address] [place] [id number] [phone]        Tawnya CrookHogan, Adain Geurin Donovan 07/04/2013, 12:14 AM

## 2013-07-04 NOTE — Discharge Instructions (Signed)

## 2013-07-08 ENCOUNTER — Encounter: Payer: 59 | Admitting: Advanced Practice Midwife

## 2013-07-26 ENCOUNTER — Encounter: Payer: 59 | Admitting: Advanced Practice Midwife

## 2013-08-02 ENCOUNTER — Encounter: Payer: Self-pay | Admitting: Advanced Practice Midwife

## 2013-08-02 ENCOUNTER — Ambulatory Visit (INDEPENDENT_AMBULATORY_CARE_PROVIDER_SITE_OTHER): Payer: 59 | Admitting: Advanced Practice Midwife

## 2013-08-02 VITALS — BP 115/75 | Temp 98.6°F | Wt 135.0 lb

## 2013-08-02 DIAGNOSIS — Z3480 Encounter for supervision of other normal pregnancy, unspecified trimester: Secondary | ICD-10-CM

## 2013-08-02 LAB — CBC
HCT: 33.3 % — ABNORMAL LOW (ref 36.0–46.0)
HEMOGLOBIN: 12.1 g/dL (ref 12.0–15.0)
MCH: 28.7 pg (ref 26.0–34.0)
MCHC: 36.3 g/dL — ABNORMAL HIGH (ref 30.0–36.0)
MCV: 78.9 fL (ref 78.0–100.0)
Platelets: 220 10*3/uL (ref 150–400)
RBC: 4.22 MIL/uL (ref 3.87–5.11)
RDW: 14.4 % (ref 11.5–15.5)
WBC: 11.1 10*3/uL — AB (ref 4.0–10.5)

## 2013-08-02 LAB — POCT URINALYSIS DIPSTICK
BILIRUBIN UA: NEGATIVE
Ketones, UA: NEGATIVE
Leukocytes, UA: NEGATIVE
NITRITE UA: NEGATIVE
Protein, UA: NEGATIVE
RBC UA: NEGATIVE
Spec Grav, UA: <=1.005
Urobilinogen, UA: NEGATIVE
pH, UA: 6

## 2013-08-02 NOTE — Addendum Note (Signed)
Addended by: Marya LandryFOSTER, Raegan Winders D on: 08/02/2013 04:31 PM   Modules accepted: Orders

## 2013-08-02 NOTE — Progress Notes (Signed)
Subjective: [name] is a 23 y.o. at 30 weeks by LMP  Patient denies vaginal leaking of fluid or bleeding, denies contractions.  Reports positive fetal movment.  Discussed comfort measures in pregnancy for back and hip pain.  Objective: Filed Vitals:   08/02/13 1355  BP: 115/75  Temp: 98.6 F (37 C)   140 FHR 34 Fundal Height Fetal Position cephalic  Assessment: Patient Active Problem List   Diagnosis Date Noted  . Supervision of other normal pregnancy 06/10/2013  . 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 Unspecified vitamin D deficiency 04/25/2013  . H/O preterm delivery, currently pregnant 04/15/2013  . DRUG OVERDOSE 12/21/2007    Plan: Patient to return to clinic in 2 weeks 1 hour glucose test today. Encouraged patient to keep prenatal visits. Discuss BCM NV.  Reviewed warning signs in pregnancy. Patient to call with concerns PRN. Reviewed triage location.  15 min spent with patient greater than 80% spent in counseling and coordination of care.   Kemia Wendel Wilson SingerWren CNM

## 2013-08-02 NOTE — Progress Notes (Signed)
Pulse: 112 Patient states when she is cleaning up at home she has lower back pain on her left side. Patient states it will feel numb and that she is unable to walk.

## 2013-08-03 LAB — RPR

## 2013-08-03 LAB — HIV ANTIBODY (ROUTINE TESTING W REFLEX): HIV: NONREACTIVE

## 2013-08-04 LAB — GLUCOSE TOLERANCE, 1 HOUR (50G) W/O FASTING: GLUCOSE 1 HOUR GTT: 144 mg/dL — AB (ref 70–140)

## 2013-08-10 ENCOUNTER — Telehealth: Payer: Self-pay | Admitting: *Deleted

## 2013-08-10 NOTE — Telephone Encounter (Signed)
Message copied by Elson AreasPAUL, Relena Ivancic F on Wed Aug 10, 2013  2:01 PM ------      Message from: [place] Hills[place]EN, [place] H      Created: Wed Aug 10, 2013 12:06 AM      Regarding: Glucose       Patient failed her 1 hour. She needs to return to clinic for a 3 hour glucose test ASAP please.       ----- Message -----         From: SYSTEM         Sent: 08/07/2013  12:10 AM           To: Rickard PatienceAmy Howell Wren, CNM                   ------

## 2013-08-10 NOTE — Telephone Encounter (Signed)
Patient notified of result and appointment made for 3 hour glucose.

## 2013-08-12 ENCOUNTER — Other Ambulatory Visit: Payer: 59

## 2013-08-12 DIAGNOSIS — Z3480 Encounter for supervision of other normal pregnancy, unspecified trimester: Secondary | ICD-10-CM

## 2013-08-13 LAB — GLUCOSE TOLERANCE, 3 HOURS
Glucose Tolerance, 1 hour: 213 mg/dL — ABNORMAL HIGH (ref 70–189)
Glucose Tolerance, 2 hour: 179 mg/dL — ABNORMAL HIGH (ref 70–164)
Glucose Tolerance, Fasting: 62 mg/dL — ABNORMAL LOW (ref 70–104)
Glucose, GTT - 3 Hour: 101 mg/dL (ref 70–144)

## 2013-08-16 ENCOUNTER — Other Ambulatory Visit: Payer: Self-pay | Admitting: Advanced Practice Midwife

## 2013-08-16 ENCOUNTER — Encounter: Payer: Self-pay | Admitting: Advanced Practice Midwife

## 2013-08-16 DIAGNOSIS — O24419 Gestational diabetes mellitus in pregnancy, unspecified control: Secondary | ICD-10-CM | POA: Insufficient documentation

## 2013-08-17 ENCOUNTER — Encounter: Payer: Self-pay | Admitting: Advanced Practice Midwife

## 2013-08-17 ENCOUNTER — Encounter (HOSPITAL_COMMUNITY): Payer: Self-pay | Admitting: Advanced Practice Midwife

## 2013-08-19 ENCOUNTER — Encounter: Payer: Self-pay | Admitting: Advanced Practice Midwife

## 2013-08-19 ENCOUNTER — Ambulatory Visit (INDEPENDENT_AMBULATORY_CARE_PROVIDER_SITE_OTHER): Payer: 59 | Admitting: Advanced Practice Midwife

## 2013-08-19 VITALS — BP 130/82 | Temp 97.8°F | Wt 138.0 lb

## 2013-08-19 DIAGNOSIS — O24419 Gestational diabetes mellitus in pregnancy, unspecified control: Secondary | ICD-10-CM

## 2013-08-19 DIAGNOSIS — Z3480 Encounter for supervision of other normal pregnancy, unspecified trimester: Secondary | ICD-10-CM

## 2013-08-19 DIAGNOSIS — O99810 Abnormal glucose complicating pregnancy: Secondary | ICD-10-CM

## 2013-08-19 LAB — POCT URINALYSIS DIPSTICK
Bilirubin, UA: NEGATIVE
Ketones, UA: NEGATIVE
Leukocytes, UA: NEGATIVE
Nitrite, UA: NEGATIVE
PROTEIN UA: NEGATIVE
RBC UA: NEGATIVE
SPEC GRAV UA: 1.01
Urobilinogen, UA: NEGATIVE
pH, UA: 6

## 2013-08-19 MED ORDER — ACCU-CHEK MULTICLIX LANCETS MISC: Status: DC

## 2013-08-19 MED ORDER — ACCU-CHEK AVIVA DEVI: Status: DC

## 2013-08-19 MED ORDER — GLUCOSE BLOOD VI STRP: ORAL_STRIP | Status: DC

## 2013-08-19 NOTE — Progress Notes (Signed)
Subjective: [name] is a 23 y.o. at 33 weeks by LMP  Patient denies vaginal leaking of fluid or bleeding, denies contractions.  Reports positive fetal movment.  Denies concerns today.  Objective: Filed Vitals:   08/19/13 1343  BP: 130/82  Temp: 97.8 F (36.6 C)   140 FHR 33 Fundal Height Fetal Position cephalic  Assessment: Patient Active Problem List   Diagnosis Date Noted  . Gestational diabetes 08/16/2013  . Supervision of other normal pregnancy 06/10/2013  . 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 Unspecified vitamin D deficiency 04/25/2013  . H/O preterm delivery, currently pregnant 04/15/2013  . DRUG OVERDOSE 12/21/2007    Plan: Patient to return to clinic in 1-2 weeks Discussed DM diet in detail. Discussed DM to patient and risk to fetus.  Rx for supplies sent to pharmacy, patient to RTC to review how to use. MFM teaching, consult and US appt next week. Patient to f/u in 1 week. Reviewed warning signs in pregnancy. Patient to call with concerns PRN. Reviewed triage location.  20 min spent with patient greater than 80% spent in counseling and coordination of care.   [name] CNM

## 2013-08-19 NOTE — Progress Notes (Signed)
Pulse 112 Pt states that she is having some right sided pain under ribcage when sleeping.  Pt needs information on GDM at this visit, will provide pt with packet.

## 2013-08-22 ENCOUNTER — Ambulatory Visit: Payer: 59

## 2013-08-23 ENCOUNTER — Other Ambulatory Visit: Payer: Self-pay | Admitting: Advanced Practice Midwife

## 2013-08-23 DIAGNOSIS — O24419 Gestational diabetes mellitus in pregnancy, unspecified control: Secondary | ICD-10-CM

## 2013-08-25 ENCOUNTER — Ambulatory Visit (HOSPITAL_COMMUNITY): Payer: Medicaid Other

## 2013-08-25 ENCOUNTER — Ambulatory Visit (HOSPITAL_COMMUNITY)
Admission: RE | Admit: 2013-08-25 | Discharge: 2013-08-25 | Disposition: A | Discharge status: Home / Self Care | Payer: Medicaid Other | Source: Ambulatory Visit | Attending: Advanced Practice Midwife | Admitting: Advanced Practice Midwife

## 2013-08-25 DIAGNOSIS — Z8751 Personal history of pre-term labor: Secondary | ICD-10-CM | POA: Insufficient documentation

## 2013-08-25 DIAGNOSIS — O99810 Abnormal glucose complicating pregnancy: Secondary | ICD-10-CM | POA: Insufficient documentation

## 2013-08-25 DIAGNOSIS — O24419 Gestational diabetes mellitus in pregnancy, unspecified control: Secondary | ICD-10-CM

## 2013-09-01 ENCOUNTER — Other Ambulatory Visit (HOSPITAL_COMMUNITY): Payer: Self-pay | Admitting: *Deleted

## 2013-09-01 ENCOUNTER — Encounter: Payer: 59 | Attending: Advanced Practice Midwife | Admitting: *Deleted

## 2013-09-01 ENCOUNTER — Ambulatory Visit (HOSPITAL_COMMUNITY)
Admission: RE | Admit: 2013-09-01 | Discharge: 2013-09-01 | Disposition: A | Discharge status: Home / Self Care | Payer: 59 | Source: Ambulatory Visit | Attending: Advanced Practice Midwife | Admitting: Advanced Practice Midwife

## 2013-09-01 DIAGNOSIS — O99810 Abnormal glucose complicating pregnancy: Secondary | ICD-10-CM | POA: Insufficient documentation

## 2013-09-01 DIAGNOSIS — Z713 Dietary counseling and surveillance: Secondary | ICD-10-CM | POA: Insufficient documentation

## 2013-09-01 NOTE — ED Notes (Signed)
  Patient was seen on 09/01/13 for Gestational Diabetes self-management . The following learning objectives were met by the patient:   States the definition of Gestational Diabetes  States why dietary management is important in controlling blood glucose  Describes the effects of carbohydrates on blood glucose levels  Demonstrates ability to create a balanced meal plan  Demonstrates carbohydrate counting   States when to check blood glucose levels  Demonstrates proper blood glucose monitoring techniques  States the effect of stress and exercise on blood glucose levels  States the importance of limiting caffeine and abstaining from alcohol and smoking  Plan:  Aim for 2 Carb Choices per meal (30 grams) +/- 1 either way for breakfast Aim for 3 Carb Choices per meal (45 grams) +/- 1 either way from lunch and dinner Aim for 1-2 Carbs per snack Begin reading food labels for Total Carbohydrate and sugar grams of foods Consider  increasing your activity level by walking daily as tolerated Begin checking BG before breakfast and 2 hours after first bit of breakfast, lunch and dinner after  as directed by MD  Take medication  as directed by MD  Blood glucose monitor given: Accu Chek Nano BG Monitoring Kit Lot # [id number] Exp: 09/28/14 Blood glucose reading: $RemoveBeforeDE'201mg'[id number]$ /dl   49min pp  Rice, banana, chicken  Patient instructed to monitor glucose levels: FBS: 60 - <90 1 hour: <140 2 hour: <120  Patient received the following handouts:  Nutrition Diabetes and Pregnancy  Carbohydrate Counting List  Meal Planning worksheet  Patient will be seen for follow-up as needed.

## 2013-09-02 ENCOUNTER — Encounter: Payer: 59 | Admitting: Advanced Practice Midwife

## 2013-09-04 ENCOUNTER — Inpatient Hospital Stay (HOSPITAL_COMMUNITY)
Admission: AD | Admit: 2013-09-04 | Discharge: 2013-09-04 | Disposition: A | Discharge status: Home / Self Care | Payer: 59 | Source: Ambulatory Visit | Attending: Obstetrics & Gynecology | Admitting: Obstetrics & Gynecology

## 2013-09-04 ENCOUNTER — Encounter (HOSPITAL_COMMUNITY): Payer: Self-pay | Admitting: *Deleted

## 2013-09-04 DIAGNOSIS — O4700 False labor before 37 completed weeks of gestation, unspecified trimester: Secondary | ICD-10-CM | POA: Insufficient documentation

## 2013-09-04 DIAGNOSIS — O99810 Abnormal glucose complicating pregnancy: Secondary | ICD-10-CM | POA: Diagnosis not present

## 2013-09-04 HISTORY — DX: Gestational diabetes mellitus in pregnancy, unspecified control: O24.419

## 2013-09-04 LAB — URINALYSIS, ROUTINE W REFLEX MICROSCOPIC
Bilirubin Urine: NEGATIVE
GLUCOSE, UA: NEGATIVE mg/dL
Hgb urine dipstick: NEGATIVE
KETONES UR: NEGATIVE mg/dL
Nitrite: NEGATIVE
PH: 6 (ref 5.0–8.0)
Protein, ur: NEGATIVE mg/dL
Specific Gravity, Urine: 1.015 (ref 1.005–1.030)
Urobilinogen, UA: 0.2 mg/dL (ref 0.0–1.0)

## 2013-09-04 LAB — URINE MICROSCOPIC-ADD ON

## 2013-09-04 NOTE — MAU Provider Note (Signed)
Chief Complaint:  Contractions   First Provider Initiated Contact with Patient 09/04/13 1310      HPI: [name] is a 23 y.o. G2P0101 at 5167w2d who presents to maternity admissions reporting Contractions since 0700. Denies fever, chills, urinary complaints, leakage of fluid, vaginal bleeding, vaginal discharge or vaginal itching. Good fetal movement.   Pregnancy Course: GDM. Hx PTD at 36 weeks.   Past Medical History: Past Medical History  Diagnosis Date  . Gestational diabetes     Past obstetric history: OB History  Gravida Para Term Preterm AB SAB TAB Ectopic Multiple Living  2 1  1      1     # Outcome Date GA Lbr Len/2nd Weight Sex Delivery Anes PTL Lv  2 CUR           1 PRE 11/23/10 2822w0d  2.892 kg (6 lb 6 oz) M SVD EPI  Y      Past Surgical History: Past Surgical History  Procedure Laterality Date  . Vaginal delivery       Family History: Family History  Problem Relation Age of Onset  . Heart disease Neg Hx   . Diabetes Neg Hx   . Cancer Neg Hx   . Hypertension Mother     Social History: History  Substance Use Topics  . Smoking status: Never Smoker   . Smokeless tobacco: Never Used  . Alcohol Use: No    Allergies: No Known Allergies  Meds:  Prescriptions prior to admission  Medication Sig Dispense Refill  . Prenat-FeCbn-FeAspGl-FA-Omega (OB COMPLETE PETITE) 35-5-1-200 MG CAPS Take 1 tablet by mouth daily.  30 capsule  9  . Blood Glucose Monitoring Suppl (ACCU-CHEK AVIVA) device Use as instructed  1 each  0  . glucose blood test strip Use as instructed  100 each  12  . Lancets (ACCU-CHEK MULTICLIX) lancets Use as instructed  100 each  12    ROS: Pertinent findings in history of present illness.  Physical Exam  Blood pressure 127/68, pulse 90, temperature 98.5 F (36.9 C), resp. rate 16, height 4\' 11"  (1.499 m), weight 64.864 kg (143 lb), last menstrual period 12/31/2012. GENERAL: Well-developed, well-nourished female in mild distress.  HEENT:  normocephalic HEART: normal rate RESP: normal effort ABDOMEN: Soft, non-tender, gravid appropriate for gestational age EXTREMITIES: Nontender, no edema NEURO: alert and oriented SPECULUM EXAM: Deferred Dilation: 2.5 Effacement (%): Thick Cervical Position: Posterior Station: Ballotable Scant bloody show after exam Exam by:: [name]  FHT:  Baseline 145 , moderate variability, accelerations present, no decelerations Contractions: q 3 mins, mil-moderate   Labs: Results for orders placed during the hospital encounter of 09/04/13 (from the past 24 hour(s))  URINALYSIS, ROUTINE W REFLEX MICROSCOPIC     Status: Abnormal   Collection Time    09/04/13 12:35 PM      Result Value Ref Range   Color, Urine YELLOW  YELLOW   APPearance CLEAR  CLEAR   Specific Gravity, Urine 1.015  1.005 - 1.030   pH 6.0  5.0 - 8.0   Glucose, UA NEGATIVE  NEGATIVE mg/dL   Hgb urine dipstick NEGATIVE  NEGATIVE   Bilirubin Urine NEGATIVE  NEGATIVE   Ketones, ur NEGATIVE  NEGATIVE mg/dL   Protein, ur NEGATIVE  NEGATIVE mg/dL   Urobilinogen, UA 0.2  0.0 - 1.0 mg/dL   Nitrite NEGATIVE  NEGATIVE   Leukocytes, UA SMALL (*) NEGATIVE  URINE MICROSCOPIC-ADD ON     Status: Abnormal   Collection Time  09/04/13 12:35 PM      Result Value Ref Range   Squamous Epithelial / LPF FEW (*) RARE   WBC, UA 11-20  <3 WBC/hpf   RBC / HPF 0-2  <3 RBC/hpf   Bacteria, UA FEW (*) RARE   Urine-Other RARE YEAST      Imaging:  NA  MAU Course: Push PO fluids.  Up to bathroom 4 x during MAU visit. Inquired again about UTI Sx. Denies. Will culture urine.   No Change in contractions.   Dilation: 3 Effacement (%): Thick Cervical Position: Posterior Station: Ballotable Exam by:: [name]  Assessment: 1. Preterm uterine contractions, antepartum    Plan: Discharge home  Preterm labor precautions and fetal kick counts. Monistat PRN. Pelvic rest x 1 week.  Urine culture pending. Follow-up Information   Follow up with  [facility]. (tomorrow as scheduled or as needed if symptoms worsen)    Contact information:   [address] [place] [address] [phone]      Follow up with [facility]. (As needed, If symptoms worsen)    Contact information:   [address] [id number] [place] [address] [phone]       Medication List         ACCU-CHEK AVIVA device  Use as instructed     accu-chek multiclix lancets  Use as instructed     glucose blood test strip  Use as instructed     OB COMPLETE PETITE 35-5-1-200 MG Caps  Take 1 tablet by mouth daily.        [place], [place] 09/04/2013 2:57 PM

## 2013-09-04 NOTE — Discharge Instructions (Signed)
Braxton Hicks Contractions Pregnancy is commonly associated with contractions of the uterus throughout the pregnancy. Towards the end of pregnancy (32 to 34 weeks), these contractions (Braxton Hicks) can develop more often and may become more forceful. This is not true labor because these contractions do not result in opening (dilatation) and thinning of the cervix. They are sometimes difficult to tell apart from true labor because these contractions can be forceful and people have different pain tolerances. You should not feel embarrassed if you go to the hospital with false labor. Sometimes, the only way to tell if you are in true labor is for your caregiver to follow the changes in the cervix. How to tell the difference between true and false labor:  False labor.  The contractions of false labor are usually shorter, irregular and not as hard as those of true labor.  They are often felt in the front of the lower abdomen and in the groin.  They may leave with walking around or changing positions while lying down.  They get weaker and are shorter lasting as time goes on.  These contractions are usually irregular.  They do not usually become progressively stronger, regular and closer together as with true labor.  True labor.  Contractions in true labor last 30 to 70 seconds, become very regular, usually become more intense, and increase in frequency.  They do not go away with walking.  The discomfort is usually felt in the top of the uterus and spreads to the lower abdomen and low back.  True labor can be determined by your caregiver with an exam. This will show that the cervix is dilating and getting thinner. If there are no prenatal problems or other health problems associated with the pregnancy, it is completely safe to be sent home with false labor and await the onset of true labor. HOME CARE INSTRUCTIONS   Keep up with your usual exercises and instructions.  Take medications as  directed.  Keep your regular prenatal appointment.  Eat and drink lightly if you think you are going into labor.  If BH contractions are making you uncomfortable:  Change your activity position from lying down or resting to walking/walking to resting.  Sit and rest in a tub of warm water.  Drink 2 to 3 glasses of water. Dehydration may cause B-H contractions.  Do slow and deep breathing several times an hour. SEEK IMMEDIATE MEDICAL CARE IF:   Your contractions continue to become stronger, more regular, and closer together.  You have a gushing, burst or leaking of fluid from the vagina.  An oral temperature above 102 F (38.9 C) develops.  You have passage of blood-tinged mucus.  You develop vaginal bleeding.  You develop continuous belly (abdominal) pain.  You have low back pain that you never had before.  You feel the baby's head pushing down causing pelvic pressure.  The baby is not moving as much as it used to. Document Released: 06/16/2005 Document Revised: 09/08/2011 Document Reviewed: 03/28/2013 [employer] Patient Information 2014 [employer].  Fetal Movement Counts Patient Name: __________________________________________________ Patient Due Date: ____________________ Performing a fetal movement count is highly recommended in high-risk pregnancies, but it is good for every pregnant woman to do. Your caregiver may ask you to start counting fetal movements at 28 weeks of the pregnancy. Fetal movements often increase:  After eating a full meal.  After physical activity.  After eating or drinking something sweet or cold.  At rest. Pay attention to when you feel   the baby is most active. This will help you notice a pattern of your baby's sleep and wake cycles and what factors contribute to an increase in fetal movement. It is important to perform a fetal movement count at the same time each day when your baby is normally most active.  HOW TO COUNT FETAL  MOVEMENTS 1. Find a quiet and comfortable area to sit or lie down on your left side. Lying on your left side provides the best blood and oxygen circulation to your baby. 2. Write down the day and time on a sheet of paper or in a journal. 3. Start counting kicks, flutters, swishes, rolls, or jabs in a 2 hour period. You should feel at least 10 movements within 2 hours. 4. If you do not feel 10 movements in 2 hours, wait 2 3 hours and count again. Look for a change in the pattern or not enough counts in 2 hours. SEEK MEDICAL CARE IF:  You feel less than 10 counts in 2 hours, tried twice.  There is no movement in over an hour.  The pattern is changing or taking longer each day to reach 10 counts in 2 hours.  You feel the baby is not moving as he or she usually does. Date: ____________ Movements: ____________ Start time: ____________ Finish time: ____________  Date: ____________ Movements: ____________ Start time: ____________ Finish time: ____________ Date: ____________ Movements: ____________ Start time: ____________ Finish time: ____________ Date: ____________ Movements: ____________ Start time: ____________ Finish time: ____________ Date: ____________ Movements: ____________ Start time: ____________ Finish time: ____________ Date: ____________ Movements: ____________ Start time: ____________ Finish time: ____________ Date: ____________ Movements: ____________ Start time: ____________ Finish time: ____________ Date: ____________ Movements: ____________ Start time: ____________ Finish time: ____________  Date: ____________ Movements: ____________ Start time: ____________ Finish time: ____________ Date: ____________ Movements: ____________ Start time: ____________ Finish time: ____________ Date: ____________ Movements: ____________ Start time: ____________ Finish time: ____________ Date: ____________ Movements: ____________ Start time: ____________ Finish time: ____________ Date: ____________  Movements: ____________ Start time: ____________ Finish time: ____________ Date: ____________ Movements: ____________ Start time: ____________ Finish time: ____________ Date: ____________ Movements: ____________ Start time: ____________ Finish time: ____________  Date: ____________ Movements: ____________ Start time: ____________ Finish time: ____________ Date: ____________ Movements: ____________ Start time: ____________ Finish time: ____________ Date: ____________ Movements: ____________ Start time: ____________ Finish time: ____________ Date: ____________ Movements: ____________ Start time: ____________ Finish time: ____________ Date: ____________ Movements: ____________ Start time: ____________ Finish time: ____________ Date: ____________ Movements: ____________ Start time: ____________ Finish time: ____________ Date: ____________ Movements: ____________ Start time: ____________ Finish time: ____________  Date: ____________ Movements: ____________ Start time: ____________ Finish time: ____________ Date: ____________ Movements: ____________ Start time: ____________ Finish time: ____________ Date: ____________ Movements: ____________ Start time: ____________ Finish time: ____________ Date: ____________ Movements: ____________ Start time: ____________ Finish time: ____________ Date: ____________ Movements: ____________ Start time: ____________ Finish time: ____________ Date: ____________ Movements: ____________ Start time: ____________ Finish time: ____________ Date: ____________ Movements: ____________ Start time: ____________ Finish time: ____________  Date: ____________ Movements: ____________ Start time: ____________ Finish time: ____________ Date: ____________ Movements: ____________ Start time: ____________ Finish time: ____________ Date: ____________ Movements: ____________ Start time: ____________ Finish time: ____________ Date: ____________ Movements: ____________ Start time:  ____________ Finish time: ____________ Date: ____________ Movements: ____________ Start time: ____________ Finish time: ____________ Date: ____________ Movements: ____________ Start time: ____________ Finish time: ____________ Date: ____________ Movements: ____________ Start time: ____________ Finish time: ____________  Date: ____________ Movements: ____________ Start time: ____________ Finish time: ____________ Date: ____________ Movements: ____________ Start   time: ____________ Finish time: ____________ Date: ____________ Movements: ____________ Start time: ____________ Finish time: ____________ Date: ____________ Movements: ____________ Start time: ____________ Finish time: ____________ Date: ____________ Movements: ____________ Start time: ____________ Finish time: ____________ Date: ____________ Movements: ____________ Start time: ____________ Finish time: ____________ Date: ____________ Movements: ____________ Start time: ____________ Finish time: ____________  Date: ____________ Movements: ____________ Start time: ____________ Finish time: ____________ Date: ____________ Movements: ____________ Start time: ____________ Finish time: ____________ Date: ____________ Movements: ____________ Start time: ____________ Finish time: ____________ Date: ____________ Movements: ____________ Start time: ____________ Finish time: ____________ Date: ____________ Movements: ____________ Start time: ____________ Finish time: ____________ Date: ____________ Movements: ____________ Start time: ____________ Finish time: ____________ Date: ____________ Movements: ____________ Start time: ____________ Finish time: ____________  Date: ____________ Movements: ____________ Start time: ____________ Finish time: ____________ Date: ____________ Movements: ____________ Start time: ____________ Finish time: ____________ Date: ____________ Movements: ____________ Start time: ____________ Finish time: ____________ Date:  ____________ Movements: ____________ Start time: ____________ Finish time: ____________ Date: ____________ Movements: ____________ Start time: ____________ Finish time: ____________ Date: ____________ Movements: ____________ Start time: ____________ Finish time: ____________ Document Released: 07/16/2006 Document Revised: 06/02/2012 Document Reviewed: 04/12/2012 [employer] Patient Information 2014 [employer].  

## 2013-09-04 NOTE — MAU Note (Signed)
Pt presents with complaints of contractions that started around 7 this morning. Denies any vaginal bleeding or LOF.

## 2013-09-05 LAB — CULTURE, OB URINE
COLONY COUNT: NO GROWTH
Culture: NO GROWTH
Special Requests: NORMAL

## 2013-09-14 ENCOUNTER — Ambulatory Visit (INDEPENDENT_AMBULATORY_CARE_PROVIDER_SITE_OTHER): Payer: 59 | Admitting: Obstetrics

## 2013-09-14 VITALS — BP 127/84 | Wt 144.0 lb

## 2013-09-14 DIAGNOSIS — Z3400 Encounter for supervision of normal first pregnancy, unspecified trimester: Secondary | ICD-10-CM

## 2013-09-14 LAB — POCT URINALYSIS DIPSTICK
BILIRUBIN UA: NEGATIVE
GLUCOSE UA: NEGATIVE
Ketones, UA: NEGATIVE
Leukocytes, UA: NEGATIVE
NITRITE UA: NEGATIVE
RBC UA: NEGATIVE
SPEC GRAV UA: 1.01
Urobilinogen, UA: NEGATIVE
pH, UA: 6

## 2013-09-14 NOTE — Progress Notes (Signed)
Pulse 94 Pt is doing well.

## 2013-09-15 ENCOUNTER — Encounter: Payer: Self-pay | Admitting: Obstetrics

## 2013-09-19 ENCOUNTER — Inpatient Hospital Stay (HOSPITAL_COMMUNITY)
Admission: AD | Admit: 2013-09-19 | Discharge: 2013-09-20 | Disposition: A | Discharge status: Home / Self Care | Payer: 59 | Source: Ambulatory Visit | Attending: Obstetrics & Gynecology | Admitting: Obstetrics & Gynecology

## 2013-09-19 ENCOUNTER — Encounter (HOSPITAL_COMMUNITY): Payer: Self-pay | Admitting: *Deleted

## 2013-09-19 ENCOUNTER — Inpatient Hospital Stay (HOSPITAL_COMMUNITY): Payer: 59

## 2013-09-19 DIAGNOSIS — O09899 Supervision of other high risk pregnancies, unspecified trimester: Secondary | ICD-10-CM

## 2013-09-19 DIAGNOSIS — R1012 Left upper quadrant pain: Secondary | ICD-10-CM | POA: Diagnosis not present

## 2013-09-19 DIAGNOSIS — O9989 Other specified diseases and conditions complicating pregnancy, childbirth and the puerperium: Principal | ICD-10-CM

## 2013-09-19 DIAGNOSIS — Z3480 Encounter for supervision of other normal pregnancy, unspecified trimester: Secondary | ICD-10-CM

## 2013-09-19 DIAGNOSIS — O99891 Other specified diseases and conditions complicating pregnancy: Secondary | ICD-10-CM | POA: Insufficient documentation

## 2013-09-19 DIAGNOSIS — M940 Chondrocostal junction syndrome [Tietze]: Secondary | ICD-10-CM | POA: Insufficient documentation

## 2013-09-19 DIAGNOSIS — O09219 Supervision of pregnancy with history of pre-term labor, unspecified trimester: Secondary | ICD-10-CM

## 2013-09-19 DIAGNOSIS — D582 Other hemoglobinopathies: Secondary | ICD-10-CM

## 2013-09-19 DIAGNOSIS — O24419 Gestational diabetes mellitus in pregnancy, unspecified control: Secondary | ICD-10-CM

## 2013-09-19 DIAGNOSIS — E559 Vitamin D deficiency, unspecified: Secondary | ICD-10-CM

## 2013-09-19 LAB — CBC
HCT: 32.5 % — ABNORMAL LOW (ref 36.0–46.0)
Hemoglobin: 12 g/dL (ref 12.0–15.0)
MCH: 28 pg (ref 26.0–34.0)
MCHC: 36.9 g/dL — ABNORMAL HIGH (ref 30.0–36.0)
MCV: 75.8 fL — ABNORMAL LOW (ref 78.0–100.0)
PLATELETS: 224 10*3/uL (ref 150–400)
RBC: 4.29 MIL/uL (ref 3.87–5.11)
RDW: 14.1 % (ref 11.5–15.5)
WBC: 12.4 10*3/uL — AB (ref 4.0–10.5)

## 2013-09-19 MED ORDER — GI COCKTAIL ~~LOC~~
30.0000 mL | Freq: Once | ORAL | Status: AC
  Administered 2013-09-19: 30 mL via ORAL
  Filled 2013-09-19: qty 30

## 2013-09-19 MED ORDER — PROMETHAZINE HCL 25 MG/ML IJ SOLN: 12.5000 mg | Freq: Once | INTRAMUSCULAR | Status: DC

## 2013-09-19 MED ORDER — MORPHINE SULFATE 10 MG/ML IJ SOLN: 10.0000 mg | Freq: Once | INTRAMUSCULAR | Status: DC

## 2013-09-19 NOTE — MAU Provider Note (Signed)
History     CSN: [id number]  Arrival date and time: 09/19/13 1814   First Provider Initiated Contact with Patient 09/19/13 1943      Chief Complaint  Patient presents with  . Abdominal Pain   Abdominal Pain Pertinent negatives include no constipation, diarrhea, dysuria, fever, nausea or vomiting.   This is a 23 y.o. female at 3731w3d who presents with c/o Left upper quadrant pain since yesterday. Denies acid reflux, nausea, vomiting, diarrhea or constipation. + fetal movement.  No labor  RN Note:  Patient states she has had left upper abdominal pain under the breast since yesterday afternoon. States it is getting worse and she was unable to sleep. Denies bleeding or leaking. Reports good fetal movement. Has some irregular contractions.        OB History   Grav Para Term Preterm Abortions TAB SAB Ect Mult Living   2 1  1      1       Past Medical History  Diagnosis Date  . Gestational diabetes     Past Surgical History  Procedure Laterality Date  . Vaginal delivery      Family History  Problem Relation Age of Onset  . Heart disease Neg Hx   . Diabetes Neg Hx   . Cancer Neg Hx   . Hypertension Mother     History  Substance Use Topics  . Smoking status: Never Smoker   . Smokeless tobacco: Never Used  . Alcohol Use: No    Allergies: No Known Allergies  Prescriptions prior to admission  Medication Sig Dispense Refill  . Prenat-FeCbn-FeAspGl-FA-Omega (OB COMPLETE PETITE) 35-5-1-200 MG CAPS Take 1 tablet by mouth daily.  30 capsule  9  . Blood Glucose Monitoring Suppl (ACCU-CHEK AVIVA) device Use as instructed  1 each  0  . glucose blood test strip Use as instructed  100 each  12  . Lancets (ACCU-CHEK MULTICLIX) lancets Use as instructed  100 each  12    Review of Systems  Constitutional: Negative for fever, chills and malaise/fatigue.  Gastrointestinal: Positive for abdominal pain. Negative for nausea, vomiting, diarrhea and constipation.  Genitourinary:  Negative for dysuria.  Neurological: Negative for dizziness.   Physical Exam   Blood pressure 120/74, pulse 92, temperature 98.5 F (36.9 C), temperature source Oral, resp. rate 16, height 4\' 8"  (1.422 m), weight 66.86 kg (147 lb 6.4 oz), last menstrual period 12/31/2012, SpO2 100.00%.  Physical Exam  Constitutional: She is oriented to person, place, and time. She appears well-developed and well-nourished. No distress.  HENT:  Head: Normocephalic.  Cardiovascular: Normal rate.   Respiratory: Effort normal.  GI: Soft. She exhibits no distension and no mass. There is tenderness (Left upper quadrant). There is no rebound and no guarding.  Musculoskeletal: Normal range of motion.  Neurological: She is alert and oriented to person, place, and time.  Skin: Skin is warm and dry.  Psychiatric: She has a normal mood and affect.   FHR reactive Some uterine irritability, no contractions  MAU Course  Procedures  MDM  Will check CBC and give GI cocktail.   Assessment and Plan   Results for orders placed during the hospital encounter of 09/19/13 (from the past 24 hour(s))  CBC     Status: Abnormal   Collection Time    09/19/13  8:11 PM      Result Value Ref Range   WBC 12.4 (*) 4.0 - 10.5 K/uL   RBC 4.29  3.87 - 5.11 MIL/uL  Hemoglobin 12.0  12.0 - 15.0 g/dL   HCT 11.9 (*) 14.7 - 82.9 %   MCV 75.8 (*) 78.0 - 100.0 fL   MCH 28.0  26.0 - 34.0 pg   MCHC 36.9 (*) 30.0 - 36.0 g/dL   RDW 56.2  13.0 - 86.5 %   Platelets 224  150 - 400 K/uL   US Abdomen Complete  09/20/2013   CLINICAL DATA:  Left upper quadrant abdominal pain.  EXAM: ULTRASOUND ABDOMEN COMPLETE  COMPARISON:  No priors.  FINDINGS: Gallbladder:  No gallstones or wall thickening visualized. No sonographic Murphy sign noted.  Common bile duct:  Diameter: 4 mm in the porta hepatis.  Liver:  No focal lesion identified. Within normal limits in parenchymal echogenicity.  IVC:  No abnormality visualized.  Pancreas:  Could not be  visualized secondary to overlying bowel gas.  Spleen:  Size and appearance within normal limits.  7.4 cm in length.  Right Kidney:  Length: 9.7 cm. Echogenicity within normal limits. No mass or hydronephrosis visualized.  Left Kidney:  Length: 11.1 cm. Echogenicity within normal limits. No mass or hydronephrosis visualized.  Abdominal aorta:  Could not be visualized secondary to overlying bowel gas.  Other findings:  None.  IMPRESSION: 1. No acute findings to account for the patient's symptoms.   Electronically Signed   By: Trudie Reed M.D.   On: 09/20/2013 00:08    DIscussed with Dr Gaynell Face.  ORdered Abdominal / renal US Report to oncoming CNM  Assessment and Plan Costochondritis Comfort measures reviewed Patient declines any pain medication Labor precautions Fetal kick counts Return to MAU as needed  [facility] 09/19/2013, 8:06 PM   Tawnya Crook

## 2013-09-19 NOTE — MAU Note (Signed)
Patient states she has had left upper abdominal pain under the breast since yesterday afternoon. States it is getting worse and she was unable to sleep. Denies bleeding or leaking. Reports good fetal movement. Has some irregular contractions.

## 2013-09-20 ENCOUNTER — Ambulatory Visit (HOSPITAL_COMMUNITY): Admission: RE | Admit: 2013-09-20 | Payer: 59 | Source: Ambulatory Visit

## 2013-09-20 ENCOUNTER — Encounter (HOSPITAL_COMMUNITY): Payer: Self-pay | Admitting: *Deleted

## 2013-09-20 ENCOUNTER — Inpatient Hospital Stay (HOSPITAL_COMMUNITY)
Admission: AD | Admit: 2013-09-20 | Discharge: 2013-09-23 | DRG: 775 | Disposition: A | Discharge status: Home / Self Care | Payer: 59 | Source: Ambulatory Visit | Attending: Obstetrics & Gynecology | Admitting: Obstetrics & Gynecology

## 2013-09-20 DIAGNOSIS — O4290 Premature rupture of membranes, unspecified as to length of time between rupture and onset of labor, unspecified weeks of gestation: Principal | ICD-10-CM | POA: Diagnosis present

## 2013-09-20 DIAGNOSIS — O99814 Abnormal glucose complicating childbirth: Secondary | ICD-10-CM | POA: Diagnosis present

## 2013-09-20 DIAGNOSIS — O24419 Gestational diabetes mellitus in pregnancy, unspecified control: Secondary | ICD-10-CM

## 2013-09-20 DIAGNOSIS — O09219 Supervision of pregnancy with history of pre-term labor, unspecified trimester: Secondary | ICD-10-CM

## 2013-09-20 DIAGNOSIS — D582 Other hemoglobinopathies: Secondary | ICD-10-CM

## 2013-09-20 DIAGNOSIS — E559 Vitamin D deficiency, unspecified: Secondary | ICD-10-CM

## 2013-09-20 DIAGNOSIS — Z3480 Encounter for supervision of other normal pregnancy, unspecified trimester: Secondary | ICD-10-CM

## 2013-09-20 DIAGNOSIS — O99891 Other specified diseases and conditions complicating pregnancy: Secondary | ICD-10-CM | POA: Diagnosis not present

## 2013-09-20 DIAGNOSIS — O09899 Supervision of other high risk pregnancies, unspecified trimester: Secondary | ICD-10-CM

## 2013-09-20 DIAGNOSIS — O99810 Abnormal glucose complicating pregnancy: Secondary | ICD-10-CM

## 2013-09-20 LAB — CBC
HCT: 33 % — ABNORMAL LOW (ref 36.0–46.0)
HEMOGLOBIN: 12.2 g/dL (ref 12.0–15.0)
MCH: 28 pg (ref 26.0–34.0)
MCHC: 37 g/dL — ABNORMAL HIGH (ref 30.0–36.0)
MCV: 75.9 fL — AB (ref 78.0–100.0)
PLATELETS: 232 10*3/uL (ref 150–400)
RBC: 4.35 MIL/uL (ref 3.87–5.11)
RDW: 14 % (ref 11.5–15.5)
WBC: 14.1 10*3/uL — AB (ref 4.0–10.5)

## 2013-09-20 LAB — GLUCOSE, CAPILLARY: Glucose-Capillary: 58 mg/dL — ABNORMAL LOW (ref 70–99)

## 2013-09-20 LAB — TYPE AND SCREEN
ABO/RH(D): AB POS
Antibody Screen: NEGATIVE

## 2013-09-20 LAB — ABO/RH: ABO/RH(D): AB POS

## 2013-09-20 MED ORDER — FLEET ENEMA 7-19 GM/118ML RE ENEM: 1.0000 | ENEMA | RECTAL | Status: DC | PRN

## 2013-09-20 MED ORDER — LACTATED RINGERS IV SOLN
INTRAVENOUS | Status: DC
  Administered 2013-09-20 – 2013-09-21 (×2): via INTRAVENOUS

## 2013-09-20 MED ORDER — OXYTOCIN BOLUS FROM INFUSION: 500.0000 mL | INTRAVENOUS | Status: DC

## 2013-09-20 MED ORDER — OXYTOCIN 40 UNITS IN LACTATED RINGERS INFUSION - SIMPLE MED: 62.5000 mL/h | INTRAVENOUS | Status: DC

## 2013-09-20 MED ORDER — MISOPROSTOL 25 MCG QUARTER TABLET
75.0000 ug | ORAL_TABLET | ORAL | Status: DC
  Administered 2013-09-20: 75 ug via ORAL
  Filled 2013-09-20: qty 0.75

## 2013-09-20 MED ORDER — MISOPROSTOL 25 MCG QUARTER TABLET: 75.0000 ug | ORAL_TABLET | ORAL | Status: DC

## 2013-09-20 MED ORDER — CITRIC ACID-SODIUM CITRATE 334-500 MG/5ML PO SOLN: 30.0000 mL | ORAL | Status: DC | PRN

## 2013-09-20 MED ORDER — PENICILLIN G POTASSIUM 5000000 UNITS IJ SOLR: 5.0000 10*6.[IU] | Freq: Once | INTRAVENOUS | Status: DC

## 2013-09-20 MED ORDER — ONDANSETRON HCL 4 MG/2ML IJ SOLN: 4.0000 mg | Freq: Four times a day (QID) | INTRAMUSCULAR | Status: DC | PRN

## 2013-09-20 MED ORDER — IBUPROFEN 600 MG PO TABS: 600.0000 mg | ORAL_TABLET | Freq: Four times a day (QID) | ORAL | Status: DC | PRN

## 2013-09-20 MED ORDER — ACETAMINOPHEN 325 MG PO TABS: 650.0000 mg | ORAL_TABLET | ORAL | Status: DC | PRN

## 2013-09-20 MED ORDER — TERBUTALINE SULFATE 1 MG/ML IJ SOLN: 0.2500 mg | Freq: Once | INTRAMUSCULAR | Status: AC | PRN

## 2013-09-20 MED ORDER — LACTATED RINGERS IV SOLN: 500.0000 mL | INTRAVENOUS | Status: DC | PRN

## 2013-09-20 MED ORDER — LIDOCAINE HCL (PF) 1 % IJ SOLN
30.0000 mL | INTRAMUSCULAR | Status: DC | PRN
  Filled 2013-09-20: qty 30

## 2013-09-20 MED ORDER — OXYCODONE-ACETAMINOPHEN 5-325 MG PO TABS: 1.0000 | ORAL_TABLET | ORAL | Status: DC | PRN

## 2013-09-20 MED ORDER — DEXTROSE 5 % IV SOLN: 2.5000 10*6.[IU] | INTRAVENOUS | Status: DC

## 2013-09-20 NOTE — MAU Note (Signed)
Pt to bathroom for dry clothes, clothes are wet, started leaking ago- clear fluid. No bleeding.

## 2013-09-20 NOTE — Discharge Instructions (Signed)
Costochondritis Costochondritis, sometimes called Tietze syndrome, is a swelling and irritation (inflammation) of the tissue (cartilage) that connects your ribs with your breastbone (sternum). It causes pain in the chest and rib area. Costochondritis usually goes away on its own over time. It can take up to 6 weeks or longer to get better, especially if you are unable to limit your activities. CAUSES  Some cases of costochondritis have no known cause. Possible causes include:  Injury (trauma).  Exercise or activity such as lifting.  Severe coughing. SIGNS AND SYMPTOMS  Pain and tenderness in the chest and rib area.  Pain that gets worse when coughing or taking deep breaths.  Pain that gets worse with specific movements. DIAGNOSIS  Your health care provider will do a physical exam and ask about your symptoms. Chest X-rays or other tests may be done to rule out other problems. TREATMENT  Costochondritis usually goes away on its own over time. Your health care provider may prescribe medicine to help relieve pain. HOME CARE INSTRUCTIONS   Avoid exhausting physical activity. Try not to strain your ribs during normal activity. This would include any activities using chest, abdominal, and side muscles, especially if heavy weights are used.  Apply ice to the affected area for the first 2 days after the pain begins.  Put ice in a plastic bag.  Place a towel between your skin and the bag.  Leave the ice on for 20 minutes, 2 3 times a day.  Only take over-the-counter or prescription medicines as directed by your health care provider. SEEK MEDICAL CARE IF:  You have redness or swelling at the rib joints. These are signs of infection.  Your pain does not go away despite rest or medicine. SEEK IMMEDIATE MEDICAL CARE IF:   Your pain increases or you are very uncomfortable.  You have shortness of breath or difficulty breathing.  You cough up blood.  You have worse chest pains,  sweating, or vomiting.  You have a fever or persistent symptoms for more than 2 3 days.  You have a fever and your symptoms suddenly get worse. MAKE SURE YOU:   Understand these instructions.  Will watch your condition.  Will get help right away if you are not doing well or get worse. Document Released: 03/26/2005 Document Revised: 04/06/2013 Document Reviewed: 01/18/2013 [facility] Patient Information 2014 [place], [place].  Vim s?n s??n (Costochondritis) Vim s?n s??n, ?i khi ???c g?i l h?i ch?ng Tietze, l tnh tr?ng s?ng v kch ?ng (vim) cc m (s?n) n?i x??ng s??n v?i x??ng ng?c (x??ng ?c). N gy ?au ? ng?c v khu v?c x??ng s??n. Vim s?n s??n th??ng t? kh?i sau m?t th?i gian. C th? m?t t?i 6 tu?n ho?c lu h?n ?? b?nh ?? h?n, ??c bi?t l n?u qu v? khng th? h?n ch? cc ho?t ??ng c?a mnh. NGUYN NHN  M?t s? tr??ng h?p vim s?n s??n khng r nguyn nhn. Nh?ng nguyn nhn c th? c bao g?m:  T?n th??ng (ch?n th??ng).  T?p th? d?c ho?c ho?t ??ng ch?ng h?n nh? nng nh?c.  Ho d? d?i. D?U HI?U V TRI?U CH?NG  ?au v c?m gic ?au ? ng?c v khu v?c x??ng s??n.  ?au n?ng h?n khi ho ho?c th? su.  ?au n?ng h?n khi c cc c? ??ng ring bi?t. CH?N ?ON  Chuyn gia ch?m [place] s?c kh?e c?a qu v? s? khm th?c th? v h?i v? nh?ng tri?u ch?ng c?a qu v?. C th? ch?p X quang l?ng ng?c  ho?c cc xt nghi?m khc ?? lo?i tr? nh?ng v?n ?? khc. ?I?U TR?  Vim s?n s??n th??ng t? kh?i sau m?t th?i gian. Chuyn gia ch?m [place] s?c kh?e c th? k ??n thu?c ?? gi?m ?au. H??NG D?N CH?M [place] T?I NH   Trnh ho?t ??ng th? l?c g?ng s?c. C? g?ng khng ko c?ng x??ng s??n trong khi ho?t ??ng bnh th??ng. ?i?u ny c th? bao g?m b?t k? ho?t ??ng no c s? d?ng c? ng?c, b?ng, v ? m?ng s??n, ??c bi?t l n?u s? d?ng cc v?t n?ng.  Ch??m ? vo vng b? ?nh h??ng trong 2 ngy ??u sau khi b?t ??u ?au.  Cho ? vo ti nh?a.  ?? kh?n t?m vo gi?a da v ti.  ?? ? l?nh trong kho?ng 20 pht, 2 - 3 l?n m?t  ngy.  Ch? s? d?ng thu?c khng c?n k ??n ho?c thu?c c?n k ??n theo ch? d?n c?a chuyn gia ch?m [place] s?c kh?e. ?I KHM N?U:  Qu v? b? t?y ?? ho?c s?ng ? cc kh?p x??ng s??n. ?y l nh?ng d?u hi?u nhi?m trng.  C?n ?au c?a qu v? khng h?t m?c d ? ngh? ng?i ho?c dng thu?c. NGAY L?P T?C ?I KHM N?U:   C?n ?au c?a qu v? t?ng ln ho?c c?m th?y r?t kh ch?u.  Qu v? b? th? d?c ho?c kh th?.  Qu v? ho ra mu.  Qu v? b? ?au ng?c n?ng h?n, ?? m? hi, ho?c nn m?a.  Qu v? b? s?t ho?c cc tri?u ch?ng ko di h?n 2 - 3 ngy.  Qu v? b? s?t v cc tri?u ch?ng c?a qu v? ??t nhin n?ng ln. ??M B?O QU V?:   Hi?u cc h??ng d?n ny.  S? theo di tnh tr?ng c?a mnh.  S? yu c?u tr? gip ngay l?p t?c n?u b?n c?m th?y khng kh?e ho?c th?y tr?m tr?ng h?n. Document Released: 03/26/2005 Document Revised: 04/06/2013 [facility] Patient Information 2014 [place], [place]

## 2013-09-21 ENCOUNTER — Encounter (HOSPITAL_COMMUNITY): Payer: 59 | Admitting: Anesthesiology

## 2013-09-21 ENCOUNTER — Encounter (HOSPITAL_COMMUNITY): Payer: Self-pay | Admitting: *Deleted

## 2013-09-21 ENCOUNTER — Inpatient Hospital Stay (HOSPITAL_COMMUNITY): Payer: 59 | Admitting: Anesthesiology

## 2013-09-21 LAB — RPR: RPR: NONREACTIVE

## 2013-09-21 MED ORDER — FENTANYL 2.5 MCG/ML BUPIVACAINE 1/10 % EPIDURAL INFUSION (WH - ANES)
INTRAMUSCULAR | Status: AC
  Filled 2013-09-21: qty 125

## 2013-09-21 MED ORDER — BUTORPHANOL TARTRATE 1 MG/ML IJ SOLN
INTRAMUSCULAR | Status: AC
  Filled 2013-09-21: qty 2

## 2013-09-21 MED ORDER — OXYCODONE-ACETAMINOPHEN 5-325 MG PO TABS
1.0000 | ORAL_TABLET | ORAL | Status: DC | PRN
  Administered 2013-09-21 – 2013-09-23 (×4): 1 via ORAL
  Filled 2013-09-21 (×4): qty 1

## 2013-09-21 MED ORDER — FENTANYL 2.5 MCG/ML BUPIVACAINE 1/10 % EPIDURAL INFUSION (WH - ANES)
INTRAMUSCULAR | Status: DC | PRN
  Administered 2013-09-21: 11 mL/h via EPIDURAL

## 2013-09-21 MED ORDER — MEASLES, MUMPS & RUBELLA VAC ~~LOC~~ INJ
0.5000 mL | INJECTION | Freq: Once | SUBCUTANEOUS | Status: DC
  Filled 2013-09-21: qty 0.5

## 2013-09-21 MED ORDER — PHENYLEPHRINE 40 MCG/ML (10ML) SYRINGE FOR IV PUSH (FOR BLOOD PRESSURE SUPPORT)
PREFILLED_SYRINGE | INTRAVENOUS | Status: AC
  Filled 2013-09-21: qty 10

## 2013-09-21 MED ORDER — PENICILLIN G POTASSIUM 5000000 UNITS IJ SOLR
2.5000 10*6.[IU] | INTRAMUSCULAR | Status: DC
  Administered 2013-09-21 (×2): 2.5 10*6.[IU] via INTRAVENOUS
  Filled 2013-09-21 (×7): qty 2.5

## 2013-09-21 MED ORDER — LANOLIN HYDROUS EX OINT: TOPICAL_OINTMENT | CUTANEOUS | Status: DC | PRN

## 2013-09-21 MED ORDER — SENNOSIDES-DOCUSATE SODIUM 8.6-50 MG PO TABS
2.0000 | ORAL_TABLET | ORAL | Status: DC
  Administered 2013-09-22 (×2): 2 via ORAL
  Filled 2013-09-21 (×2): qty 2

## 2013-09-21 MED ORDER — EPHEDRINE 5 MG/ML INJ
10.0000 mg | INTRAVENOUS | Status: DC | PRN
  Filled 2013-09-21: qty 2

## 2013-09-21 MED ORDER — IBUPROFEN 600 MG PO TABS
600.0000 mg | ORAL_TABLET | Freq: Four times a day (QID) | ORAL | Status: DC
  Administered 2013-09-21 – 2013-09-23 (×8): 600 mg via ORAL
  Filled 2013-09-21 (×8): qty 1

## 2013-09-21 MED ORDER — ONDANSETRON HCL 4 MG PO TABS: 4.0000 mg | ORAL_TABLET | ORAL | Status: DC | PRN

## 2013-09-21 MED ORDER — LACTATED RINGERS IV SOLN: 500.0000 mL | Freq: Once | INTRAVENOUS | Status: DC

## 2013-09-21 MED ORDER — BENZOCAINE-MENTHOL 20-0.5 % EX AERO: 1.0000 "application " | INHALATION_SPRAY | CUTANEOUS | Status: DC | PRN

## 2013-09-21 MED ORDER — BUTORPHANOL TARTRATE 1 MG/ML IJ SOLN
2.0000 mg | INTRAMUSCULAR | Status: DC | PRN
  Administered 2013-09-21: 2 mg via INTRAVENOUS

## 2013-09-21 MED ORDER — PRENATAL MULTIVITAMIN CH
1.0000 | ORAL_TABLET | Freq: Every day | ORAL | Status: DC
  Administered 2013-09-22 – 2013-09-23 (×2): 1 via ORAL
  Filled 2013-09-21 (×2): qty 1

## 2013-09-21 MED ORDER — OXYTOCIN 40 UNITS IN LACTATED RINGERS INFUSION - SIMPLE MED
1.0000 m[IU]/min | INTRAVENOUS | Status: DC
  Administered 2013-09-21: 2 m[IU]/min via INTRAVENOUS
  Administered 2013-09-21: 1 m[IU]/min via INTRAVENOUS
  Filled 2013-09-21: qty 1000

## 2013-09-21 MED ORDER — TETANUS-DIPHTH-ACELL PERTUSSIS 5-2.5-18.5 LF-MCG/0.5 IM SUSP
0.5000 mL | Freq: Once | INTRAMUSCULAR | Status: AC
  Administered 2013-09-22: 0.5 mL via INTRAMUSCULAR
  Filled 2013-09-21: qty 0.5

## 2013-09-21 MED ORDER — LIDOCAINE HCL (PF) 1 % IJ SOLN
INTRAMUSCULAR | Status: DC | PRN
  Administered 2013-09-21: 4 mL
  Administered 2013-09-21: 3 mL

## 2013-09-21 MED ORDER — FENTANYL 2.5 MCG/ML BUPIVACAINE 1/10 % EPIDURAL INFUSION (WH - ANES): 14.0000 mL/h | INTRAMUSCULAR | Status: DC | PRN

## 2013-09-21 MED ORDER — DIPHENHYDRAMINE HCL 25 MG PO CAPS: 25.0000 mg | ORAL_CAPSULE | Freq: Four times a day (QID) | ORAL | Status: DC | PRN

## 2013-09-21 MED ORDER — EPHEDRINE 5 MG/ML INJ
INTRAVENOUS | Status: AC
  Filled 2013-09-21: qty 4

## 2013-09-21 MED ORDER — ONDANSETRON HCL 4 MG/2ML IJ SOLN: 4.0000 mg | INTRAMUSCULAR | Status: DC | PRN

## 2013-09-21 MED ORDER — DIBUCAINE 1 % RE OINT: 1.0000 "application " | TOPICAL_OINTMENT | RECTAL | Status: DC | PRN

## 2013-09-21 MED ORDER — MAGNESIUM HYDROXIDE 400 MG/5ML PO SUSP: 30.0000 mL | ORAL | Status: DC | PRN

## 2013-09-21 MED ORDER — ZOLPIDEM TARTRATE 5 MG PO TABS: 5.0000 mg | ORAL_TABLET | Freq: Every evening | ORAL | Status: DC | PRN

## 2013-09-21 MED ORDER — PHENYLEPHRINE 40 MCG/ML (10ML) SYRINGE FOR IV PUSH (FOR BLOOD PRESSURE SUPPORT)
80.0000 ug | PREFILLED_SYRINGE | INTRAVENOUS | Status: DC | PRN
  Filled 2013-09-21: qty 2

## 2013-09-21 MED ORDER — PENICILLIN G POTASSIUM 5000000 UNITS IJ SOLR
5.0000 10*6.[IU] | Freq: Once | INTRAVENOUS | Status: AC
  Administered 2013-09-21: 5 10*6.[IU] via INTRAVENOUS
  Filled 2013-09-21: qty 5

## 2013-09-21 MED ORDER — DIPHENHYDRAMINE HCL 50 MG/ML IJ SOLN: 12.5000 mg | INTRAMUSCULAR | Status: DC | PRN

## 2013-09-21 MED ORDER — FERROUS SULFATE 325 (65 FE) MG PO TABS
325.0000 mg | ORAL_TABLET | Freq: Two times a day (BID) | ORAL | Status: DC
Start: 2013-09-21 — End: 2013-09-23
  Administered 2013-09-21 – 2013-09-22 (×3): 325 mg via ORAL
  Filled 2013-09-21 (×3): qty 1

## 2013-09-21 MED ORDER — WITCH HAZEL-GLYCERIN EX PADS: 1.0000 "application " | MEDICATED_PAD | CUTANEOUS | Status: DC | PRN

## 2013-09-21 NOTE — Anesthesia Preprocedure Evaluation (Signed)
Anesthesia Evaluation  Patient identified by MRN, date of birth, ID band Patient awake    Reviewed: Allergy & Precautions, H&P , Patient's Chart, lab work & pertinent test results  Airway Mallampati: III TM Distance: >3 FB Neck ROM: Full    Dental no notable dental hx. (+) Teeth Intact   Pulmonary neg pulmonary ROS,  breath sounds clear to auscultation  Pulmonary exam normal       Cardiovascular negative cardio ROS  Rhythm:Regular Rate:Normal     Neuro/Psych negative neurological ROS  negative psych ROS   GI/Hepatic negative GI ROS, Neg liver ROS,   Endo/Other  diabetes, Well Controlled, Gestational  Renal/GU   negative genitourinary   Musculoskeletal negative musculoskeletal ROS (+)   Abdominal   Peds  Hematology negative hematology ROS (+)   Anesthesia Other Findings   Reproductive/Obstetrics (+) Pregnancy                           Anesthesia Physical Anesthesia Plan  ASA: II  Anesthesia Plan: Epidural   Post-op Pain Management:    Induction:   Airway Management Planned: Natural Airway  Additional Equipment:   Intra-op Plan:   Post-operative Plan:   Informed Consent: I have reviewed the patients History and Physical, chart, labs and discussed the procedure including the risks, benefits and alternatives for the proposed anesthesia with the patient or authorized representative who has indicated his/her understanding and acceptance.     Plan Discussed with: Anesthesiologist  Anesthesia Plan Comments:         Anesthesia Quick Evaluation

## 2013-09-21 NOTE — H&P (Signed)
[name] is a 23 y.o. female presenting for PROM. Maternal Medical History:  Reason for admission: Rupture of membranes.  23 yo G2 P1.  EDC 10-07-13.  Presents with leaking clear fluid.  Irregular, mild UC's.  Fetal activity: Perceived fetal activity is normal.   Last perceived fetal movement was within the past hour.    Prenatal complications: no prenatal complications Prenatal Complications - Diabetes: gestational. Diabetes is managed by diet.      OB History   Grav Para Term Preterm Abortions TAB SAB Ect Mult Living   2 1  1      1      Past Medical History  Diagnosis Date  . Gestational diabetes    Past Surgical History  Procedure Laterality Date  . Vaginal delivery     Family History: family history includes Hypertension in her mother. There is no history of Heart disease, Diabetes, or Cancer. Social History:  reports that she has never smoked. She has never used smokeless tobacco. She reports that she does not drink alcohol or use illicit drugs.   Prenatal Transfer Tool  Maternal Diabetes: Yes:  Diabetes Type:  Diet controlled Genetic Screening: Normal Maternal Ultrasounds/Referrals: Normal Fetal Ultrasounds or other Referrals:  Referred to Materal Fetal Medicine  Maternal Substance Abuse:  No Significant Maternal Medications:  None Significant Maternal Lab Results:  None Other Comments:  None  Review of Systems  All other systems reviewed and are negative.    Dilation: 2 Effacement (%): Thick Station: -3 Exam by:: E.Clapper, RN Blood pressure 136/67, pulse 87, temperature 99.2 F (37.3 C), temperature source Oral, resp. rate 16, height 4\' 8"  (1.422 m), weight 147 lb 6.4 oz (66.86 kg), last menstrual period 12/31/2012, SpO2 99.00%. Maternal Exam:  Abdomen: Patient reports no abdominal tenderness. Introitus: Normal vulva. Normal vagina.  Pelvis: adequate for delivery.   Cervix: Cervix evaluated by digital exam.     Physical Exam  Nursing note and vitals  reviewed. Constitutional: She is oriented to person, place, and time. She appears well-developed and well-nourished.  HENT:  Head: Normocephalic and atraumatic.  Eyes: Conjunctivae are normal. Pupils are equal, round, and reactive to light.  Neck: Normal range of motion. Neck supple.  Cardiovascular: Normal rate and regular rhythm.   Respiratory: Effort normal.  GI: Soft.  Genitourinary: Vagina normal and uterus normal.  Musculoskeletal: Normal range of motion.  Neurological: She is alert and oriented to person, place, and time.  Skin: Skin is warm and dry.  Psychiatric: She has a normal mood and affect. Her behavior is normal. Judgment and thought content normal.    Prenatal labs: ABO, Rh: --/--/AB POS, AB POS (03/24 1950) Antibody: NEG (03/24 1950) Rubella: 10.00 (10/17 1447) RPR: NON REACTIVE (03/24 1950)  HBsAg: NEGATIVE (10/17 1447)  HIV: NON REACTIVE (02/03 1633)  GBS:     Assessment/Plan: 37 weeks.  PROM.  Unfavorable cervix.  2 stage IOL.   HARPER,CHARLES A 09/21/2013, 4:28 AM

## 2013-09-21 NOTE — Anesthesia Postprocedure Evaluation (Signed)
Anesthesia Post Note  Patient: [name]  Procedure(s) Performed: * No procedures listed *  Anesthesia type: Epidural  Patient location: Mother/Baby  Post pain: Pain level controlled  Post assessment: Post-op Vital signs reviewed  Last Vitals:  Filed Vitals:   09/21/13 1436  BP: 127/83  Pulse: 102  Temp: 36.4 C  Resp: 18    Post vital signs: Reviewed  Level of consciousness:alert  Complications: No apparent anesthesia complications

## 2013-09-21 NOTE — Anesthesia Procedure Notes (Signed)
Epidural Patient location during procedure: OB Start time: 09/21/2013 9:40 AM  Staffing Anesthesiologist: Justine Dines A. Performed by: anesthesiologist   Preanesthetic Checklist Completed: patient identified, site marked, surgical consent, pre-op evaluation, timeout performed, IV checked, risks and benefits discussed and monitors and equipment checked  Epidural Patient position: sitting Prep: site prepped and draped and DuraPrep Patient monitoring: continuous pulse ox and blood pressure Approach: midline Location: L3-L4 Injection technique: LOR air  Needle:  Needle type: Tuohy  Needle gauge: 17 G Needle length: 9 cm and 9 Needle insertion depth: 3 cm Catheter type: closed end flexible Catheter size: 19 Gauge Catheter at skin depth: 8 cm Test dose: negative and Other  Assessment Events: blood not aspirated, injection not painful, no injection resistance, negative IV test and no paresthesia  Additional Notes Patient identified. Risks and benefits discussed including failed block, incomplete  Pain control, post dural puncture headache, nerve damage, paralysis, blood pressure Changes, nausea, vomiting, reactions to medications-both toxic and allergic and post Partum back pain. All questions were answered. Patient expressed understanding and wished to proceed. Sterile technique was used throughout procedure. Epidural site was Dressed with sterile barrier dressing. No paresthesias, signs of intravascular injection Or signs of intrathecal spread were encountered.  Patient was more comfortable after the epidural was dosed. Please see RN's note for documentation of vital signs and FHR which are stable.]

## 2013-09-22 ENCOUNTER — Encounter: Payer: 59 | Admitting: Obstetrics & Gynecology

## 2013-09-22 LAB — CBC
HEMATOCRIT: 30.3 % — AB (ref 36.0–46.0)
Hemoglobin: 10.9 g/dL — ABNORMAL LOW (ref 12.0–15.0)
MCH: 27.5 pg (ref 26.0–34.0)
MCHC: 36 g/dL (ref 30.0–36.0)
MCV: 76.5 fL — ABNORMAL LOW (ref 78.0–100.0)
PLATELETS: 205 10*3/uL (ref 150–400)
RBC: 3.96 MIL/uL (ref 3.87–5.11)
RDW: 14.1 % (ref 11.5–15.5)
WBC: 15.9 10*3/uL — AB (ref 4.0–10.5)

## 2013-09-22 NOTE — Progress Notes (Signed)
Post Partum Day 1 Subjective: no complaints  Objective: Blood pressure 116/73, pulse 102, temperature 98.2 F (36.8 C), temperature source Oral, resp. rate 18, height 4\' 8"  (1.422 m), weight 147 lb 6.4 oz (66.86 kg), last menstrual period 12/31/2012, SpO2 98.00%, unknown if currently breastfeeding.  Physical Exam:  General: alert and no distress Lochia: appropriate Uterine Fundus: firm Incision: none DVT Evaluation: No evidence of DVT seen on physical exam.   Recent Labs  09/20/13 1950 09/22/13 0640  HGB 12.2 10.9*  HCT 33.0* 30.3*    Assessment/Plan: Plan for discharge tomorrow   LOS: 2 days   Lilyanah Celestin A 09/22/2013, 8:57 AM

## 2013-09-22 NOTE — Progress Notes (Signed)
CSW referral received to assess "hx of drug OD" however it appears the incident occurred in 2009, per chart review. Drug screens were not ordered. CSW intervention was not provided. Please reconsult if necessary.      

## 2013-09-22 NOTE — Lactation Note (Addendum)
This note was copied from the chart of Danielle Jackquline BoschLan Kearn. Lactation Consultation Note: Follow up visit with mom. She is sleepy and reports that baby fed about 45 minutes ago. He is asleep in Dad's arms. Reports that he is nursing well. Has been giving bottles of formula also. Encouraged to always BF first to promote a good milk supply. No questions at present. To call prn  Patient Name: [name] EAVWU'JToday's Date: 09/22/2013 Reason for consult: Follow-up assessment   Maternal Data Formula Feeding for Exclusion: Yes Reason for exclusion: Mother's choice to formula and breast feed on admission  Feeding Feeding Type: Breast Fed Length of feed: 25 min  LATCH Score/Interventions                      Lactation Tools Discussed/Used     Consult Status Consult Status: PRN    Pamelia HoitWeeks, Jayani Rozman D 09/22/2013, 1:55 PM

## 2013-09-23 ENCOUNTER — Ambulatory Visit: Payer: Self-pay

## 2013-09-23 MED ORDER — IBUPROFEN 600 MG PO TABS: 600.0000 mg | ORAL_TABLET | Freq: Four times a day (QID) | ORAL | Status: DC | PRN

## 2013-09-23 MED ORDER — PNV PRENATAL PLUS MULTIVITAMIN 27-1 MG PO TABS: 1.0000 | ORAL_TABLET | Freq: Every day | ORAL | Status: DC

## 2013-09-23 MED ORDER — OXYCODONE-ACETAMINOPHEN 5-325 MG PO TABS: 1.0000 | ORAL_TABLET | ORAL | Status: DC | PRN

## 2013-09-23 NOTE — Progress Notes (Signed)
Post Partum Day 2 Subjective: no complaints  Objective: Blood pressure 116/74, pulse 89, temperature 98.1 F (36.7 C), temperature source Oral, resp. rate 16, height 4\' 8"  (1.422 m), weight 147 lb 6.4 oz (66.86 kg), last menstrual period 12/31/2012, SpO2 98.00%, unknown if currently breastfeeding.  Physical Exam:  General: alert and no distress Lochia: appropriate Uterine Fundus: firm Incision: None DVT Evaluation: No evidence of DVT seen on physical exam.   Recent Labs  09/20/13 1950 09/22/13 0640  HGB 12.2 10.9*  HCT 33.0* 30.3*    Assessment/Plan: Discharge home   LOS: 3 days   [name] A 09/23/2013, 8:19 AM

## 2013-09-23 NOTE — Discharge Instructions (Signed)
Before Baby Comes Home °Ask any questions about feeding, diapering, and baby care before you leave the hospital. Ask again if you do not understand. Ask when you need to see the doctor again. °There are several things you must have before your baby comes home. °· Infant car seat. °· Crib. °· Do not let your baby sleep in a bed with you or anyone else. °· If you do not have a bed for your baby, ask the doctor what you can use that will be safe for the baby to sleep in. °Infant feeding supplies: °· 6 to 8 bottles (8 oz. size). °· 6 to 8 nipples. °· Measuring cup. °· Measuring tablespoon. °· Bottle brush. °· Sterilizer (or use any large pan or kettle with a lid). °· Formula that contains iron. °· A way to boil and cool water. °Breastfeeding supplies: °· Breast pump. °· Nipple cream. °Clothing: °· 24 to 36 cloth diapers and waterproof diaper covers or a box of disposable diapers. You may need as many as 10 to 12 diapers per day. °· 3 onesies (other clothing will depend on the time of year and the weather). °· 3 receiving blankets. °· 3 baby pajamas or gowns. °· 3 bibs. °Bath equipment: °· Mild soap. °· Petroleum jelly. No baby oil or powder. °· Soft cloth towel and wash cloth. °· Cotton balls. °· Separate bath basin for baby. Only sponge bathe until umbilical cord and circumcision are healed. °Other supplies: °· Thermometer and bulb syringe (ask the hospital to send them home with you). Ask your doctor about how you should take your baby's temperature. °· One to two pacifiers. °Prepare for an emergency: °· Know how to get to the hospital and know where to admit your baby. °· Put all doctor numbers near your house phone and in your cell phone if you have one. °Prepare your family: °· Talk with siblings about the baby coming home and how they feel about it. °· Decide how you want to handle visitors and other family members. °· Take offers for help with the baby. You will need time to adjust. °Know when to call the doctor.   °GET HELP RIGHT AWAY IF: °· Your baby's temperature is greater than 100.4° F (38° C). °· The softspot on your baby's head starts to bulge. °· Your baby is crying with no tears or has no wet diapers for 6 hours. °· Your baby has rapid breathing. °· Your baby is not as alert. °Document Released: 05/29/2008 Document Revised: 09/08/2011 Document Reviewed: 09/05/2010 °[employer]® Patient Information ©2014 [employer]. ° °

## 2013-09-23 NOTE — Lactation Note (Signed)
This note was copied from the chart of Danielle Jackquline BoschLan Bourassa. Lactation Consultation Note  Patient Name: [name] ZOXWR'UToday's Date: 09/23/2013 Reason for consult: Follow-up assessment;Breast/nipple pain;Other (Comment) (boarderline engorgement ) Per mom will have to stay until tomorrow due to baby's jaundice. Baby awake, rooting. LC changed diaper. LC assisted mom with latch and noted borderline engorgement bilaterally , especially the lateral aspects of the breast and inner. Latched , worked with depth , baby has  a small mouth and needed assist with depth . Multiply swallows noted , increased with breast compressions .  Baby fed for 20 mins, on the right and breast softened down , still lateral and inner aspects areas of boarder line engorgement. Reviewed hand expressing  And showed mom how to use hand pump with approx 10 ml yield off left breast ,per mom some relief. Baby woke up , tried to relatch after diaper change , noted to be sleepy . Continue to hand express some more milk off both breast. LC fixed reusable ice packs for mom and comfort gels for sore nipples ( no breakdown noted ). MBU RN - aware mom is boarder line engorged and mom needs to be checked on to prevent the engorgement from escalating. Mom also aware. Mom did ask if she could feed the baby back the EBM in a bottle . LC highly recommended avoiding formula or EBM in a bottle for now because mom will get better relief  latching baby at the breast.    Maternal Data Has patient been taught Hand Expression?: Yes (reviewed , along with pumping )  Feeding Feeding Type: Breast Fed Length of feed: 5 min  LATCH Score/Interventions Latch: Grasps breast easily, tongue down, lips flanged, rhythmical sucking. Intervention(s): Skin to skin;Teach feeding cues;Waking techniques Intervention(s): Adjust position;Assist with latch;Breast massage;Breast compression  Audible Swallowing: Spontaneous and intermittent  Type of Nipple: Everted at  rest and after stimulation  Comfort (Breast/Nipple): Filling, red/small blisters or bruises, mild/mod discomfort  Problem noted: Filling  Hold (Positioning): Assistance needed to correctly position infant at breast and maintain latch. Intervention(s): Breastfeeding basics reviewed  LATCH Score: 8  Lactation Tools Discussed/Used Tools: Pump;Flanges Flange Size: 27 Breast pump type: Manual Pump Review: Setup, frequency, and cleaning Initiated by:: MAI  Date initiated:: 09/23/13   Consult Status Consult Status: Follow-up (see LC note ) Date: 09/23/13 Follow-up type: In-patient    Kathrin Greathouseorio, Coal Nearhood Ann 09/23/2013, 12:20 PM

## 2013-09-23 NOTE — Discharge Summary (Signed)
Obstetric Discharge Summary Reason for Admission: rupture of membranes Prenatal Procedures: ultrasound Intrapartum Procedures: spontaneous vaginal delivery Postpartum Procedures: none Complications-Operative and Postpartum: none Hemoglobin  Date Value Ref Range Status  09/22/2013 10.9* 12.0 - 15.0 g/dL Final     HCT  Date Value Ref Range Status  09/22/2013 30.3* 36.0 - 46.0 % Final    Physical Exam:  General: alert and no distress Lochia: appropriate Uterine Fundus: firm Incision: None DVT Evaluation: No evidence of DVT seen on physical exam.  Discharge Diagnoses: Term Pregnancy-delivered  Discharge Information: Date: 09/23/2013 Activity: pelvic rest Diet: routine Medications: PNV, Ibuprofen, Colace and Percocet Condition: stable Instructions: refer to practice specific booklet Discharge to: home Follow-up Information   Follow up with Antionette CharJACKSON-MOORE,LISA A, MD. Schedule an appointment as soon as possible for a visit in 2 weeks.   Specialty:  Obstetrics and Gynecology   Contact information:   [address] [place] [address] [phone]       Newborn Data: Live born female  Birth Weight: 6 lb 5 oz (2863 g) APGAR: 7, 9  Home with mother.  HARPER,CHARLES A 09/23/2013, 8:27 AM

## 2013-10-12 ENCOUNTER — Ambulatory Visit: Payer: 59 | Admitting: Obstetrics

## 2014-05-01 ENCOUNTER — Encounter (HOSPITAL_COMMUNITY): Payer: Self-pay | Admitting: *Deleted

## 2014-06-26 ENCOUNTER — Encounter: Payer: Self-pay | Admitting: *Deleted

## 2014-06-27 ENCOUNTER — Encounter: Payer: Self-pay | Admitting: Obstetrics & Gynecology

## 2015-01-23 ENCOUNTER — Emergency Department (HOSPITAL_COMMUNITY): Payer: Medicaid Other

## 2015-01-23 ENCOUNTER — Emergency Department (HOSPITAL_COMMUNITY)
Admission: EM | Admit: 2015-01-23 | Discharge: 2015-01-23 | Disposition: A | Discharge status: Home / Self Care | Payer: Medicaid Other | Attending: Emergency Medicine | Admitting: Emergency Medicine

## 2015-01-23 ENCOUNTER — Encounter (HOSPITAL_COMMUNITY): Payer: Self-pay | Admitting: *Deleted

## 2015-01-23 DIAGNOSIS — R05 Cough: Secondary | ICD-10-CM | POA: Insufficient documentation

## 2015-01-23 DIAGNOSIS — R059 Cough, unspecified: Secondary | ICD-10-CM

## 2015-01-23 DIAGNOSIS — Z8632 Personal history of gestational diabetes: Secondary | ICD-10-CM | POA: Insufficient documentation

## 2015-01-23 MED ORDER — BENZONATATE 200 MG PO CAPS: 200.0000 mg | ORAL_CAPSULE | Freq: Three times a day (TID) | ORAL | Status: DC | PRN

## 2015-01-23 MED ORDER — PREDNISONE 20 MG PO TABS
40.0000 mg | ORAL_TABLET | Freq: Once | ORAL | Status: AC
  Administered 2015-01-23: 40 mg via ORAL
  Filled 2015-01-23: qty 2

## 2015-01-23 MED ORDER — PREDNISONE 10 MG PO TABS: 20.0000 mg | ORAL_TABLET | Freq: Two times a day (BID) | ORAL | Status: DC

## 2015-01-23 MED ORDER — HYDROCOD POLST-CPM POLST ER 10-8 MG/5ML PO SUER
5.0000 mL | Freq: Once | ORAL | Status: AC
  Administered 2015-01-23: 5 mL via ORAL
  Filled 2015-01-23: qty 5

## 2015-01-23 NOTE — ED Notes (Signed)
Pt A&OX4, ambulatory at d/c with steady gait, NAD 

## 2015-01-23 NOTE — ED Provider Notes (Signed)
CSN: [id number]     Arrival date & time 01/23/15  0028 History   None    Chief Complaint  Patient presents with  . Cough     (Consider location/radiation/quality/duration/timing/severity/associated sxs/prior Treatment) Patient is a 24 y.o. female presenting with cough. The history is provided by the patient.  Cough Cough characteristics:  Dry Severity:  Moderate Onset quality:  Gradual Duration:  5 weeks Timing:  Intermittent Progression:  Worsening Chronicity:  New Smoker: no   Context: occupational exposure   Relieved by:  Nothing Ineffective treatments:  None tried  [name] is a 24 y.o. female who presents to the ED with a dry cough that has been going on for about 5 weeks. 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 The symptoms have gotten worse over the past few days.    Past Medical History  Diagnosis Date  . Gestational diabetes    Past Surgical History  Procedure Laterality Date  . Vaginal delivery     Family History  Problem Relation Age of Onset  . Heart disease Neg Hx   . Diabetes Neg Hx   . Cancer Neg Hx   . Hypertension Mother    History  Substance Use Topics  . Smoking status: Never Smoker   . Smokeless tobacco: Never Used  . Alcohol Use: No   OB History    Gravida Para Term Preterm AB TAB SAB Ectopic Multiple Living   Review of Systems  Respiratory: Positive for cough.   all other systems negative    Allergies  Review of patient's allergies indicates no known allergies.  Home Medications   Prior to Admission medications   Medication Sig Start Date End Date Taking? Authorizing Provider  benzonatate (TESSALON) 200 MG capsule Take 1 capsule (200 mg total) by mouth 3 (three) times daily as needed for cough. 01/23/15   Hope Orlene Och, NP  ibuprofen (ADVIL,MOTRIN) 600 MG tablet Take 1 tablet (600 mg total) by mouth every 6 (six) hours as needed. 09/23/13   Brock Bad, MD   oxyCODONE-acetaminophen (PERCOCET/ROXICET) 5-325 MG per tablet Take 1-2 tablets by mouth every 4 (four) hours as needed for severe pain (moderate - severe pain). 09/23/13   Brock Bad, MD  predniSONE (DELTASONE) 10 MG tablet Take 2 tablets (20 mg total) by mouth 2 (two) times daily with a meal. 01/23/15   Hope Orlene Och, NP  Prenatal Vit-Fe Fumarate-FA (PNV PRENATAL PLUS MULTIVITAMIN) 27-1 MG TABS Take 1 tablet by mouth daily before breakfast. 09/23/13   Brock Bad, MD   BP 114/66 mmHg  Pulse 94  Temp(Src) 99 F (37.2 C) (Oral)  Resp 22  SpO2 99%  LMP 01/23/2015 Physical Exam  Constitutional: She is oriented to person, place, and time. She appears well-developed and well-nourished. No distress.  HENT:  Head: Normocephalic.  Mouth/Throat: Uvula is midline, oropharynx is clear and moist and mucous membranes are normal.  Eyes: Conjunctivae and EOM are normal.  Neck: Neck supple.  Cardiovascular: Normal rate.   Pulmonary/Chest: Effort normal. No respiratory distress. She has no wheezes. She has no rales.  Musculoskeletal: Normal range of motion.  Neurological: She is alert and oriented to person, place, and time. No cranial nerve deficit.  Skin: Skin is warm and dry.  Psychiatric: She has a normal mood and affect. Her behavior is normal.  Nursing  note and vitals reviewed.   ED Course  Procedures (including critical care time) Labs Review Labs Reviewed - No data to display  Imaging Review Dg Chest 2 View  01/23/2015   CLINICAL DATA:  Cough for 1 month.  Dyspnea for couple days.  EXAM: CHEST  2 VIEW  COMPARISON:  04/26/2008  FINDINGS: There is mild crowding of basilar markings due to a shallow inspiration. The lungs are clear. There is no effusion. Hilar, mediastinal and cardiac contours are unremarkable and unchanged.  IMPRESSION: No active cardiopulmonary disease.   Electronically Signed   By: Ellery Plunk M.D.   On: 01/23/2015 01:00     MDM  24 y.o. female with cough  x 5 weeks that is worse with using products at work. Will treat with prednisone and cough mediation. She will follow up with [facility] Wellness or return here for worsening symptoms. Stable for d/c without respiratory distress, O2 SAT 99% on R/A. No difficulty swallowing.   Final diagnoses:  Cough       [facility], NP 01/23/15 1610  Marisa Severin, MD 01/23/15 [phone]

## 2015-01-23 NOTE — ED Notes (Signed)
Pt in c/o dry cough for the last month, denies fever, states it is worse at night and has been worse the last few days, no distress noted

## 2015-01-23 NOTE — Discharge Instructions (Signed)
Your chest x-ray tonight is normal. 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 Take the medication as directed and follow up with [facility] and Wellness.   Cool Mist Vaporizers Vaporizers may help relieve the symptoms of a cough and cold. They add moisture to the air, which helps mucus to become thinner and less sticky. This makes it easier to breathe and cough up secretions. Cool mist vaporizers do not cause serious burns like hot mist vaporizers, which may also be called steamers or humidifiers. Vaporizers have not been proven to help with colds. You should not use a vaporizer if you are allergic to mold. HOME CARE INSTRUCTIONS  Follow the package instructions for the vaporizer.  Do not use anything other than distilled water in the vaporizer.  Do not run the vaporizer all of the time. This can cause mold or bacteria to grow in the vaporizer.  Clean the vaporizer after each time it is used.  Clean and dry the vaporizer well before storing it.  Stop using the vaporizer if worsening respiratory symptoms develop. Document Released: 03/13/2004 Document Revised: 06/21/2013 Document Reviewed: 11/03/2012 [facility] Patient Information 2015 [place], [place]. This information is not intended to replace advice given to you by your health care provider. Make sure you discuss any questions you have with your health care provider.

## 2015-01-29 ENCOUNTER — Emergency Department (HOSPITAL_COMMUNITY)
Admission: EM | Admit: 2015-01-29 | Discharge: 2015-01-30 | Disposition: A | Discharge status: Home / Self Care | Payer: Medicaid Other | Attending: Emergency Medicine | Admitting: Emergency Medicine

## 2015-01-29 ENCOUNTER — Encounter (HOSPITAL_COMMUNITY): Payer: Self-pay | Admitting: Emergency Medicine

## 2015-01-29 DIAGNOSIS — Z8632 Personal history of gestational diabetes: Secondary | ICD-10-CM | POA: Insufficient documentation

## 2015-01-29 DIAGNOSIS — Z3202 Encounter for pregnancy test, result negative: Secondary | ICD-10-CM | POA: Insufficient documentation

## 2015-01-29 DIAGNOSIS — R319 Hematuria, unspecified: Secondary | ICD-10-CM | POA: Diagnosis present

## 2015-01-29 DIAGNOSIS — Z7952 Long term (current) use of systemic steroids: Secondary | ICD-10-CM | POA: Diagnosis not present

## 2015-01-29 DIAGNOSIS — N390 Urinary tract infection, site not specified: Secondary | ICD-10-CM | POA: Diagnosis not present

## 2015-01-29 DIAGNOSIS — R51 Headache: Secondary | ICD-10-CM | POA: Diagnosis not present

## 2015-01-29 LAB — BASIC METABOLIC PANEL
Anion gap: 8 (ref 5–15)
BUN: 10 mg/dL (ref 6–20)
CO2: 23 mmol/L (ref 22–32)
Calcium: 9.4 mg/dL (ref 8.9–10.3)
Chloride: 106 mmol/L (ref 101–111)
Creatinine, Ser: 0.69 mg/dL (ref 0.44–1.00)
GFR calc Af Amer: >60 mL/min (ref >60)
GFR calc non Af Amer: >60 mL/min (ref >60)
Glucose, Bld: 115 mg/dL — ABNORMAL HIGH (ref 65–99)
POTASSIUM: 3.7 mmol/L (ref 3.5–5.1)
Sodium: 137 mmol/L (ref 135–145)

## 2015-01-29 LAB — URINALYSIS, ROUTINE W REFLEX MICROSCOPIC
BILIRUBIN URINE: NEGATIVE
Glucose, UA: NEGATIVE mg/dL
KETONES UR: NEGATIVE mg/dL
Nitrite: NEGATIVE
Protein, ur: 30 mg/dL — AB
Specific Gravity, Urine: 1.021 (ref 1.005–1.030)
Urobilinogen, UA: 1 mg/dL (ref 0.0–1.0)
pH: 5.5 (ref 5.0–8.0)

## 2015-01-29 LAB — PREGNANCY, URINE: PREG TEST UR: NEGATIVE

## 2015-01-29 LAB — CBC
HCT: 36.1 % (ref 36.0–46.0)
Hemoglobin: 13.3 g/dL (ref 12.0–15.0)
MCH: 26.2 pg (ref 26.0–34.0)
MCHC: 36.8 g/dL — ABNORMAL HIGH (ref 30.0–36.0)
MCV: 71.1 fL — AB (ref 78.0–100.0)
Platelets: 294 10*3/uL (ref 150–400)
RBC: 5.08 MIL/uL (ref 3.87–5.11)
RDW: 12.8 % (ref 11.5–15.5)
WBC: 14.8 10*3/uL — ABNORMAL HIGH (ref 4.0–10.5)

## 2015-01-29 LAB — URINE MICROSCOPIC-ADD ON

## 2015-01-29 NOTE — ED Notes (Signed)
Pt. reports hematuria with low abdominal / mid and low back pain onset today , denies fever or chills.

## 2015-01-30 MED ORDER — CEPHALEXIN 500 MG PO CAPS: 500.0000 mg | ORAL_CAPSULE | Freq: Two times a day (BID) | ORAL | Status: DC

## 2015-01-30 MED ORDER — CEFTRIAXONE SODIUM 1 G IJ SOLR
1.0000 g | Freq: Once | INTRAMUSCULAR | Status: AC
  Administered 2015-01-30: 1 g via INTRAMUSCULAR
  Filled 2015-01-30: qty 10

## 2015-01-30 MED ORDER — LIDOCAINE HCL (PF) 1 % IJ SOLN
INTRAMUSCULAR | Status: AC
  Administered 2015-01-30: 5 mL
  Filled 2015-01-30: qty 5

## 2015-01-30 MED ORDER — LIDOCAINE HCL 2 % IJ SOLN: 5.0000 mL | Freq: Once | INTRAMUSCULAR | Status: DC

## 2015-01-30 NOTE — Discharge Instructions (Signed)

## 2015-01-30 NOTE — ED Provider Notes (Signed)
CSN: [id number]     Arrival date & time 01/29/15  2224 History   This chart was scribed for Tilden Fossa, MD by Evon Slack, ED Scribe. This patient was seen in room A03C/A03C and the patient's care was started at 11:54 AM.    Chief Complaint  Patient presents with  . Hematuria   Patient is a 24 y.o. female presenting with abdominal pain. The history is provided by the patient. No language interpreter was used.  Abdominal Pain Pain quality: burning   Pain radiates to:  Back Pain severity:  Moderate Onset quality:  Gradual Duration:  1 day Timing:  Constant Progression:  Unchanged Chronicity:  New Relieved by:  None tried Worsened by:  Urination Ineffective treatments:  None tried Associated symptoms: dysuria and hematuria   Associated symptoms: no chills, no fever and no vaginal discharge   Risk factors: no alcohol abuse    HPI Comments: [name] is a 24 y.o. female who presents to the Emergency Department complaining of abdominal pain onset today. Pt states that she has associated hematuria, dysuria, urinary frequency and back pain. Pt reports HA since 8 PM. Pt denies vaginal discharge, fever, or chills. Pt denies tobacco use, alcohol use and illicit drug use. Pt denies Hx of UTI or kidney stones.   Past Medical History  Diagnosis Date  . Gestational diabetes    Past Surgical History  Procedure Laterality Date  . Vaginal delivery     Family History  Problem Relation Age of Onset  . Heart disease Neg Hx   . Diabetes Neg Hx   . Cancer Neg Hx   . Hypertension Mother    History  Substance Use Topics  . Smoking status: Never Smoker   . Smokeless tobacco: Never Used  . Alcohol Use: Yes   OB History    Gravida Para Term Preterm AB TAB SAB Ectopic Multiple Living   2 2 1 1      2       Review of Systems  Constitutional: Negative for fever and chills.  Gastrointestinal: Positive for abdominal pain.  Genitourinary: Positive for dysuria, frequency and hematuria.  Negative for vaginal discharge.  Musculoskeletal: Positive for back pain.  Neurological: Positive for headaches.  All other systems reviewed and are negative.    Allergies  Review of patient's allergies indicates no known allergies.  Home Medications   Prior to Admission medications   Medication Sig Start Date End Date Taking? Authorizing Provider  benzonatate (TESSALON) 200 MG capsule Take 1 capsule (200 mg total) by mouth 3 (three) times daily as needed for cough. 01/23/15   Hope Orlene Och, NP  ibuprofen (ADVIL,MOTRIN) 600 MG tablet Take 1 tablet (600 mg total) by mouth every 6 (six) hours as needed. 09/23/13   Brock Bad, MD  oxyCODONE-acetaminophen (PERCOCET/ROXICET) 5-325 MG per tablet Take 1-2 tablets by mouth every 4 (four) hours as needed for severe pain (moderate - severe pain). 09/23/13   Brock Bad, MD  predniSONE (DELTASONE) 10 MG tablet Take 2 tablets (20 mg total) by mouth 2 (two) times daily with a meal. 01/23/15   Hope Orlene Och, NP  Prenatal Vit-Fe Fumarate-FA (PNV PRENATAL PLUS MULTIVITAMIN) 27-1 MG TABS Take 1 tablet by mouth daily before breakfast. 09/23/13   Brock Bad, MD   BP 135/85 mmHg  Pulse 97  Temp(Src) 98.4 F (36.9 C) (Oral)  Resp 18  SpO2 98%  LMP 01/23/2015   Physical Exam  Constitutional: She is oriented to  person, place, and time. She appears well-developed and well-nourished.  HENT:  Head: Normocephalic and atraumatic.  Cardiovascular: Normal rate and regular rhythm.   No murmur heard. Pulmonary/Chest: Effort normal and breath sounds normal. No respiratory distress.  Abdominal: Soft. There is tenderness (mild) in the suprapubic area. There is no rebound and no guarding.  Musculoskeletal: She exhibits no edema or tenderness.  Neurological: She is alert and oriented to person, place, and time.  Skin: Skin is warm and dry.  Psychiatric: She has a normal mood and affect. Her behavior is normal.  Nursing note and vitals reviewed.   ED  Course  Procedures (including critical care time) DIAGNOSTIC STUDIES: Oxygen Saturation is 98% on RA, normal by my interpretation.    COORDINATION OF CARE: 12:13 AM-Discussed treatment plan with pt at bedside and pt agreed to plan.     Labs Review Labs Reviewed  BASIC METABOLIC PANEL - Abnormal; Notable for the following:    Glucose, Bld 115 (*)    All other components within normal limits  CBC - Abnormal; Notable for the following:    WBC 14.8 (*)    MCV 71.1 (*)    MCHC 36.8 (*)    All other components within normal limits  URINALYSIS, ROUTINE W REFLEX MICROSCOPIC (NOT AT [facility]) - Abnormal; Notable for the following:    Color, Urine RED (*)    APPearance CLOUDY (*)    Hgb urine dipstick LARGE (*)    Protein, ur 30 (*)    Leukocytes, UA LARGE (*)    All other components within normal limits  URINE MICROSCOPIC-ADD ON - Abnormal; Notable for the following:    Squamous Epithelial / LPF FEW (*)    Bacteria, UA MANY (*)    All other components within normal limits  PREGNANCY, URINE  POC URINE PREG, ED    Imaging Review No results found.   EKG Interpretation None      MDM   Final diagnoses:  Acute UTI   Patient here for evaluation of dysuria, frequency, hematuria, left flank pain. History and presentation is consistent with acute UTI/cystitis. History of presentation is not consistent with renal colic. Discussed homecare for UTI, treated with one dose of Rocephin with return precautions.   I personally performed the services described in this documentation, which was scribed in my presence. The recorded information has been reviewed and is accurate.      Tilden Fossa, MD 01/30/15 [phone]

## 2015-02-02 LAB — URINE CULTURE: Culture: >=100000

## 2015-02-04 ENCOUNTER — Telehealth (HOSPITAL_COMMUNITY): Payer: Self-pay

## 2015-02-04 NOTE — Telephone Encounter (Signed)
Post ED Visit - Positive Culture Follow-up  Culture report reviewed by antimicrobial stewardship pharmacist:  Wes Dulaney, Pharm.D., BCPS  Celedonio Miyamoto, [address].D., BCPS  Georgina Pillion, Pharm.D., BCPS  [place], [address].D., BCPS, AAHIVP  Estella Husk, Pharm.D., BCPS, AAHIVP  Elder Cyphers, [address].D., BCPS  Positive Urine culture>/= 100,000 colonies -> E Coli & Klebsiella Pneumoniae Treated with Cephalexin, organism sensitive to the same and no further patient follow-up is required at this time.  Arvid Right 02/04/2015, 5:00 AM

## 2015-05-09 ENCOUNTER — Encounter (HOSPITAL_COMMUNITY): Payer: Self-pay | Admitting: Emergency Medicine

## 2015-05-09 ENCOUNTER — Emergency Department (HOSPITAL_COMMUNITY)
Admission: EM | Admit: 2015-05-09 | Discharge: 2015-05-09 | Disposition: A | Discharge status: Home / Self Care | Payer: Medicaid Other | Attending: Emergency Medicine | Admitting: Emergency Medicine

## 2015-05-09 DIAGNOSIS — Z792 Long term (current) use of antibiotics: Secondary | ICD-10-CM | POA: Diagnosis not present

## 2015-05-09 DIAGNOSIS — T781XXA Other adverse food reactions, not elsewhere classified, initial encounter: Secondary | ICD-10-CM | POA: Insufficient documentation

## 2015-05-09 DIAGNOSIS — Y998 Other external cause status: Secondary | ICD-10-CM | POA: Insufficient documentation

## 2015-05-09 DIAGNOSIS — X58XXXA Exposure to other specified factors, initial encounter: Secondary | ICD-10-CM | POA: Insufficient documentation

## 2015-05-09 DIAGNOSIS — Y9389 Activity, other specified: Secondary | ICD-10-CM | POA: Diagnosis not present

## 2015-05-09 DIAGNOSIS — L509 Urticaria, unspecified: Secondary | ICD-10-CM

## 2015-05-09 DIAGNOSIS — Y929 Unspecified place or not applicable: Secondary | ICD-10-CM | POA: Insufficient documentation

## 2015-05-09 DIAGNOSIS — Z91018 Allergy to other foods: Secondary | ICD-10-CM

## 2015-05-09 DIAGNOSIS — Z8632 Personal history of gestational diabetes: Secondary | ICD-10-CM | POA: Insufficient documentation

## 2015-05-09 MED ORDER — PREDNISONE 20 MG PO TABS
60.0000 mg | ORAL_TABLET | Freq: Once | ORAL | Status: AC
  Administered 2015-05-09: 60 mg via ORAL
  Filled 2015-05-09: qty 3

## 2015-05-09 MED ORDER — PREDNISONE 10 MG PO TABS: 20.0000 mg | ORAL_TABLET | ORAL | Status: DC

## 2015-05-09 MED ORDER — FAMOTIDINE 20 MG PO TABS: 20.0000 mg | ORAL_TABLET | Freq: Two times a day (BID) | ORAL | Status: DC

## 2015-05-09 MED ORDER — DIPHENHYDRAMINE HCL 25 MG PO TABS: 25.0000 mg | ORAL_TABLET | Freq: Four times a day (QID) | ORAL | Status: DC | PRN

## 2015-05-09 NOTE — ED Provider Notes (Signed)
CSN: [id number]     Arrival date & time 05/09/15  2103 History  By signing my name below, I, [name], attest that this documentation has been prepared under the direction and in the presence of Fayrene HelperBowie Venesa Semidey, PA-C.  Electronically Signed: Lyndel SafeKaitlyn Cooper, ED Scribe. 05/09/2015. 9:22 PM.    Chief Complaint  Patient presents with  . Allergic Reaction   Patient is a 24 y.o. female presenting with allergic reaction. The history is provided by the patient. No language interpreter was used.  Allergic Reaction Presenting symptoms: itching, rash and swelling   Presenting symptoms: no difficulty breathing, no difficulty swallowing and no wheezing   Itching:    Location:  Full body   Severity:  Severe   Onset quality:  Sudden   Duration:  2 days   Timing:  Constant   Progression:  Worsening Swelling:    Location:  Mouth   Onset quality:  Sudden   Duration:  2 days   Timing:  Constant   Progression:  Worsening   Chronicity:  New Severity:  Mild Prior allergic episodes:  No prior episodes Context: food ( seafood)   Relieved by:  None tried Ineffective treatments:  None tried  HPI Comments: [name] is a 24 y.o. female, with no pertinent PMhx, who presents to the Emergency Department complaining of a sudden onset, constant diffuse pruritic rash to generalized body X 2 days. Pt reports she ate seafood last night and experienced mild pruritis following consumption with worsening of her symptoms today while at work. She has associated lower lip edema and erythema surrounding bilateral eyes. She has not tried any alleviating medication or treatments for her symptoms. Denies rash in mouth, trouble breathing, and abdominal cramping. No nausea, vomiting, or diarrhea.   Past Medical History  Diagnosis Date  . Gestational diabetes    Past Surgical History  Procedure Laterality Date  . Vaginal delivery     Family History  Problem Relation Age of Onset  . Heart disease Neg Hx   . Diabetes  Neg Hx   . Cancer Neg Hx   . Hypertension Mother    Social History  Substance Use Topics  . Smoking status: Never Smoker   . Smokeless tobacco: Never Used  . Alcohol Use: Yes   OB History    Gravida Para Term Preterm AB TAB SAB Ectopic Multiple Living   2 2 1 1      2      Review of Systems  Constitutional: Negative for fever.  HENT: Negative for trouble swallowing.   Respiratory: Negative for wheezing.   Gastrointestinal: Negative for nausea, vomiting, abdominal pain and diarrhea.  Skin: Positive for itching and rash.   Allergies  Review of patient's allergies indicates no known allergies.  Home Medications   Prior to Admission medications   Medication Sig Start Date End Date Taking? Authorizing Provider  benzonatate (TESSALON) 200 MG capsule Take 1 capsule (200 mg total) by mouth 3 (three) times daily as needed for cough. Patient not taking: Reported on 01/30/2015 01/23/15   Janne NapoleonHope M Neese, NP  cephALEXin (KEFLEX) 500 MG capsule Take 1 capsule (500 mg total) by mouth 2 (two) times daily. 01/30/15   Tilden FossaElizabeth Rees, MD  ibuprofen (ADVIL,MOTRIN) 600 MG tablet Take 1 tablet (600 mg total) by mouth every 6 (six) hours as needed. Patient not taking: Reported on 01/30/2015 09/23/13   Brock Badharles A Harper, MD  oxyCODONE-acetaminophen (PERCOCET/ROXICET) 5-325 MG per tablet Take 1-2 tablets by mouth every 4 (  four) hours as needed for severe pain (moderate - severe pain). Patient not taking: Reported on 01/30/2015 09/23/13   Brock Bad, MD  predniSONE (DELTASONE) 10 MG tablet Take 2 tablets (20 mg total) by mouth 2 (two) times daily with a meal. Patient not taking: Reported on 01/30/2015 01/23/15   Janne Napoleon, NP  Prenatal Vit-Fe Fumarate-FA (PNV PRENATAL PLUS MULTIVITAMIN) 27-1 MG TABS Take 1 tablet by mouth daily before breakfast. Patient not taking: Reported on 01/30/2015 09/23/13   Brock Bad, MD   BP 110/59 mmHg  Pulse 86  Temp(Src) 98 F (36.7 C) (Oral)  Resp 20  SpO2 99%  LMP  04/25/2015 Physical Exam  Constitutional: She is oriented to person, place, and time. She appears well-developed and well-nourished. No distress.  HENT:  Head: Normocephalic.  Eyes: Conjunctivae are normal.  Neck: Normal range of motion. Neck supple.  Cardiovascular: Normal rate.   Pulmonary/Chest: Effort normal. No respiratory distress.  Musculoskeletal: Normal range of motion.  Neurological: She is alert and oriented to person, place, and time. Coordination normal.  Skin: Skin is warm. Rash noted.  Pruritic hives noted throughout body that does not follow any specific dermatomal pattern, no rash in the palms of hand or soles of feet.   Psychiatric: She has a normal mood and affect. Her behavior is normal.  Nursing note and vitals reviewed.   ED Course  Procedures  DIAGNOSTIC STUDIES: Oxygen Saturation is 99% on RA, normal by my interpretation.    COORDINATION OF CARE: 9:19 PM hives, likely from seafood.  No signs of anaphylaxis. Discussed treatment plan with pt at bedside and pt agreed to plan. Will order first dose of prednisone and prescribe a prednisone course. Will also prescribe pepcid and benadryl.   MDM   Final diagnoses:  Food allergy  Hives    BP 110/59 mmHg  Pulse 86  Temp(Src) 98 F (36.7 C) (Oral)  Resp 20  SpO2 99%  LMP 04/25/2015   I personally performed the services described in this documentation, which was scribed in my presence. The recorded information has been reviewed and is accurate.      Fayrene Helper, PA-C 05/09/15 2126  Lyndal Pulley, MD 05/11/15 [phone]

## 2015-05-09 NOTE — ED Notes (Signed)
Pt st's she ate seafood last pm.  St's started itching after eating and today the itching has gotton worse

## 2015-05-09 NOTE — ED Notes (Signed)
C/o rash and itching all over that started after eating seafood yesterday.  NAD.

## 2015-05-09 NOTE — Discharge Instructions (Signed)

## 2015-09-30 ENCOUNTER — Encounter (HOSPITAL_COMMUNITY): Payer: Self-pay

## 2015-09-30 ENCOUNTER — Emergency Department (HOSPITAL_COMMUNITY)
Admission: EM | Admit: 2015-09-30 | Discharge: 2015-09-30 | Disposition: A | Discharge status: Home / Self Care | Payer: Medicaid Other | Attending: Emergency Medicine | Admitting: Emergency Medicine

## 2015-09-30 DIAGNOSIS — Z8632 Personal history of gestational diabetes: Secondary | ICD-10-CM | POA: Insufficient documentation

## 2015-09-30 DIAGNOSIS — H6692 Otitis media, unspecified, left ear: Secondary | ICD-10-CM | POA: Insufficient documentation

## 2015-09-30 DIAGNOSIS — J3489 Other specified disorders of nose and nasal sinuses: Secondary | ICD-10-CM | POA: Insufficient documentation

## 2015-09-30 DIAGNOSIS — R05 Cough: Secondary | ICD-10-CM | POA: Insufficient documentation

## 2015-09-30 DIAGNOSIS — R51 Headache: Secondary | ICD-10-CM | POA: Diagnosis not present

## 2015-09-30 DIAGNOSIS — J029 Acute pharyngitis, unspecified: Secondary | ICD-10-CM | POA: Diagnosis present

## 2015-09-30 LAB — CBC WITH DIFFERENTIAL/PLATELET
BASOS PCT: 0 %
Basophils Absolute: 0 10*3/uL (ref 0.0–0.1)
EOS ABS: 0 10*3/uL (ref 0.0–0.7)
EOS PCT: 0 %
HEMATOCRIT: 34.7 % — AB (ref 36.0–46.0)
Hemoglobin: 12.3 g/dL (ref 12.0–15.0)
LYMPHS ABS: 1.9 10*3/uL (ref 0.7–4.0)
Lymphocytes Relative: 13 %
MCH: 25.3 pg — ABNORMAL LOW (ref 26.0–34.0)
MCHC: 35.4 g/dL (ref 30.0–36.0)
MCV: 71.4 fL — ABNORMAL LOW (ref 78.0–100.0)
MONO ABS: 1.1 10*3/uL — AB (ref 0.1–1.0)
Monocytes Relative: 8 %
NEUTROS PCT: 79 %
Neutro Abs: 11.3 10*3/uL — ABNORMAL HIGH (ref 1.7–7.7)
Platelets: 241 10*3/uL (ref 150–400)
RBC: 4.86 MIL/uL (ref 3.87–5.11)
RDW: 12.7 % (ref 11.5–15.5)
WBC: 14.3 10*3/uL — AB (ref 4.0–10.5)

## 2015-09-30 LAB — COMPREHENSIVE METABOLIC PANEL
ALBUMIN: 4.1 g/dL (ref 3.5–5.0)
ALK PHOS: 41 U/L (ref 38–126)
ALT: 14 U/L (ref 14–54)
ANION GAP: 14 (ref 5–15)
AST: 16 U/L (ref 15–41)
BILIRUBIN TOTAL: 2.3 mg/dL — AB (ref 0.3–1.2)
BUN: 11 mg/dL (ref 6–20)
CALCIUM: 9 mg/dL (ref 8.9–10.3)
CO2: 18 mmol/L — ABNORMAL LOW (ref 22–32)
CREATININE: 0.74 mg/dL (ref 0.44–1.00)
Chloride: 105 mmol/L (ref 101–111)
GFR calc Af Amer: >60 mL/min (ref >60)
GFR calc non Af Amer: >60 mL/min (ref >60)
GLUCOSE: 87 mg/dL (ref 65–99)
Potassium: 3.5 mmol/L (ref 3.5–5.1)
Sodium: 137 mmol/L (ref 135–145)
TOTAL PROTEIN: 7.8 g/dL (ref 6.5–8.1)

## 2015-09-30 LAB — RAPID STREP SCREEN (MED CTR MEBANE ONLY): Streptococcus, Group A Screen (Direct): NEGATIVE

## 2015-09-30 LAB — CK: Total CK: 67 U/L (ref 38–234)

## 2015-09-30 LAB — I-STAT CG4 LACTIC ACID, ED
Lactic Acid, Venous: 0.8 mmol/L (ref 0.5–2.0)
Lactic Acid, Venous: 3.28 mmol/L (ref 0.5–2.0)

## 2015-09-30 MED ORDER — ACETAMINOPHEN 325 MG PO TABS
ORAL_TABLET | ORAL | Status: AC
  Filled 2015-09-30: qty 2

## 2015-09-30 MED ORDER — ACETAMINOPHEN 325 MG PO TABS
650.0000 mg | ORAL_TABLET | Freq: Once | ORAL | Status: AC | PRN
  Administered 2015-09-30: 650 mg via ORAL

## 2015-09-30 MED ORDER — AMOXICILLIN 500 MG PO TABS: 1000.0000 mg | ORAL_TABLET | Freq: Two times a day (BID) | ORAL | Status: DC

## 2015-09-30 NOTE — ED Provider Notes (Signed)
CSN: [id number]     Arrival date & time 09/30/15  1137 History   First MD Initiated Contact with Patient 09/30/15 1653     Chief Complaint  Patient presents with  . Generalized Body Aches     (Consider location/radiation/quality/duration/timing/severity/associated sxs/prior Treatment) Patient is a 25 y.o. female presenting with general illness. The history is provided by the patient.  Illness Severity:  Moderate Onset quality:  Gradual Duration:  2 days Timing:  Constant Chronicity:  New Associated symptoms: congestion, cough, headaches, rhinorrhea and sore throat   Associated symptoms: no chest pain, no fever, no myalgias, no nausea, no shortness of breath, no vomiting and no wheezing    25 yo F With a chief complaint of a sore throat headache. This been going on for 2 days. Patient is also had generalized body aches chills fevers. Has been eating and drinking less at home. Came in today because she felt terrible. Denies dysuria denies abdominal pain. Denies neck stiffness denies shortness of breath. Mild cough.  Past Medical History  Diagnosis Date  . Gestational diabetes    Past Surgical History  Procedure Laterality Date  . Vaginal delivery     Family History  Problem Relation Age of Onset  . Heart disease Neg Hx   . Diabetes Neg Hx   . Cancer Neg Hx   . Hypertension Mother    Social History  Substance Use Topics  . Smoking status: Never Smoker   . Smokeless tobacco: Never Used  . Alcohol Use: Yes   OB History    Gravida Para Term Preterm AB TAB SAB Ectopic Multiple Living   2 2 1 1      2      Review of Systems  Constitutional: Negative for fever and chills.  HENT: Positive for congestion, rhinorrhea and sore throat.   Eyes: Negative for redness and visual disturbance.  Respiratory: Positive for cough. Negative for shortness of breath and wheezing.   Cardiovascular: Negative for chest pain and palpitations.  Gastrointestinal: Negative for nausea and vomiting.   Genitourinary: Negative for dysuria and urgency.  Musculoskeletal: Negative for myalgias and arthralgias.  Skin: Negative for pallor and wound.  Neurological: Positive for headaches. Negative for dizziness.      Allergies  Shellfish allergy  Home Medications   Prior to Admission medications   Medication Sig Start Date End Date Taking? Authorizing Provider  acetaminophen (TYLENOL) 500 MG tablet Take 500-1,000 mg by mouth every 6 (six) hours as needed for moderate pain, fever or headache.   Yes Historical Provider, MD  DM-Doxylamine-Acetaminophen (NYQUIL COLD & FLU PO) Take 10 mLs by mouth at bedtime as needed (for flu symptoms).   Yes Historical Provider, MD  amoxicillin (AMOXIL) 500 MG tablet Take 2 tablets (1,000 mg total) by mouth 2 (two) times daily. 09/30/15   Melene Planan Hyland Mollenkopf, DO   BP 114/89 mmHg  Pulse 110  Temp(Src) 98.6 F (37 C)  Resp 16  Wt 105 lb 11.2 oz (47.945 kg)  SpO2 99%  LMP 09/22/2015 Physical Exam  Constitutional: She is oriented to person, place, and time. She appears well-developed and well-nourished. No distress.  HENT:  Head: Normocephalic and atraumatic.  Swollen turbinates.  Mild bilateral tonsillar swelling with exudates. Left anterior cervical lymphadenopathy. Left TM erythematous distortion of the landmarks and effusion.  Eyes: EOM are normal. Pupils are equal, round, and reactive to light.  Neck: Normal range of motion. Neck supple.  Cardiovascular: Normal rate and regular rhythm.  Exam reveals no gallop  and no friction rub.   No murmur heard. Pulmonary/Chest: Effort normal. She has no wheezes. She has no rales.  Abdominal: Soft. She exhibits no distension. There is no tenderness. There is no rebound and no guarding.  Musculoskeletal: She exhibits no edema or tenderness.  Neurological: She is alert and oriented to person, place, and time.  Negative Kernig's, negative Brudzinski's  Skin: Skin is warm and dry. She is not diaphoretic.  Psychiatric: She has  a normal mood and affect. Her behavior is normal.  Nursing note and vitals reviewed.   ED Course  Procedures (including critical care time) Labs Review Labs Reviewed  CBC WITH DIFFERENTIAL/PLATELET - Abnormal; Notable for the following:    WBC 14.3 (*)    HCT 34.7 (*)    MCV 71.4 (*)    MCH 25.3 (*)    Neutro Abs 11.3 (*)    Monocytes Absolute 1.1 (*)    All other components within normal limits  COMPREHENSIVE METABOLIC PANEL - Abnormal; Notable for the following:    CO2 18 (*)    Total Bilirubin 2.3 (*)    All other components within normal limits  I-STAT CG4 LACTIC ACID, ED - Abnormal; Notable for the following:    Lactic Acid, Venous 3.28 (*)    All other components within normal limits  RAPID STREP SCREEN (NOT AT [facility])  CULTURE, GROUP A STREP ([facility])  CK  URINALYSIS, ROUTINE W REFLEX MICROSCOPIC (NOT AT [facility])  I-STAT CG4 LACTIC ACID, ED    Imaging Review No results found. I have personally reviewed and evaluated these images and lab results as part of my medical decision-making.   EKG Interpretation None      MDM   Final diagnoses:  Acute left otitis media, recurrence not specified, unspecified otitis media type    25 yo F with a chief complaint of sore throat headache fevers and chills. Patient is well-appearing and nontoxic on my exam. Patient received Tylenol and feels a lot better currently. Tachycardia has resolved spontaneously. Lungs are clear to also tissue bilaterally. Patient has no noted abdominal tenderness on exam. No meningeal signs. Left-sided otitis media without signs of mastoiditis. Patient had labs drawn as part of triage process. I would not of drawn these as the patient is well-appearing and nontoxic. They have a lactic acid that is elevated. Discussed with the patient she will go home and drink plenty of fluids and Tylenol and NSAIDs.  5:34 PM:  I have discussed the diagnosis/risks/treatment options with the patient and family and believe the pt  to be eligible for discharge home to follow-up with PCP. We also discussed returning to the ED immediately if new or worsening sx occur. We discussed the sx which are most concerning (e.g., sudden worsening pain, fever, inability to tolerate by mouth, headache, neck stiffness) that necessitate immediate return. Medications administered to the patient during their visit and any new prescriptions provided to the patient are listed below.  Medications given during this visit Medications  acetaminophen (TYLENOL) tablet 650 mg (650 mg Oral Given 09/30/15 1231)    New Prescriptions   AMOXICILLIN (AMOXIL) 500 MG TABLET    Take 2 tablets (1,000 mg total) by mouth 2 (two) times daily.    The patient appears reasonably screen and/or stabilized for discharge and I doubt any other medical condition or other [facility] requiring further screening, evaluation, or treatment in the ED at this time prior to discharge.      Melene Plan, DO 09/30/15 1735

## 2015-09-30 NOTE — ED Notes (Signed)
Lab to add on CK test 

## 2015-09-30 NOTE — ED Notes (Signed)
Pt here with c.o generalized body aches and chills, onset two days ago. She also reports sore throat and painful swallowing. Denies abd pain/n/V/D.

## 2015-09-30 NOTE — Discharge Instructions (Signed)
Take tylenol 2 pills 4 times a day and motrin 4 pills 3 times a day.  Drink plenty of fluids.  Return for worsening shortness of breath, headache, confusion. Follow up with your family doctor.   Otitis Media, Adult Otitis media is redness, soreness, and puffiness (swelling) in the space just behind your eardrum (middle ear). It may be caused by allergies or infection. It often happens along with a cold. HOME CARE  Take your medicine as told. Finish it even if you start to feel better.  Only take over-the-counter or prescription medicines for pain, discomfort, or fever as told by your doctor.  Follow up with your doctor as told. GET HELP IF:  You have otitis media only in one ear, or bleeding from your nose, or both.  You notice a lump on your neck.  You are not getting better in 3-5 days.  You feel worse instead of better. GET HELP RIGHT AWAY IF:   You have pain that is not helped with medicine.  You have puffiness, redness, or pain around your ear.  You get a stiff neck.  You cannot move part of your face (paralysis).  You notice that the bone behind your ear hurts when you touch it. MAKE SURE YOU:   Understand these instructions.  Will watch your condition.  Will get help right away if you are not doing well or get worse.   This information is not intended to replace advice given to you by your health care provider. Make sure you discuss any questions you have with your health care provider.   Document Released: 12/03/2007 Document Revised: 07/07/2014 Document Reviewed: 01/11/2013 Elsevier Interactive Patient Education [employer]2016 [employer]

## 2015-09-30 NOTE — ED Notes (Signed)
Pt stable, ambulatory, states understanding of discharge instructions 

## 2015-10-04 LAB — CULTURE, GROUP A STREP (THRC)

## 2018-05-29 ENCOUNTER — Ambulatory Visit (HOSPITAL_COMMUNITY)
Admission: EM | Admit: 2018-05-29 | Discharge: 2018-05-29 | Disposition: A | Discharge status: Home / Self Care | Payer: Self-pay | Attending: Family Medicine | Admitting: Family Medicine

## 2018-05-29 ENCOUNTER — Encounter (HOSPITAL_COMMUNITY): Payer: Self-pay

## 2018-05-29 DIAGNOSIS — Z91013 Allergy to seafood: Secondary | ICD-10-CM | POA: Insufficient documentation

## 2018-05-29 DIAGNOSIS — Z79899 Other long term (current) drug therapy: Secondary | ICD-10-CM | POA: Insufficient documentation

## 2018-05-29 DIAGNOSIS — R350 Frequency of micturition: Secondary | ICD-10-CM | POA: Insufficient documentation

## 2018-05-29 DIAGNOSIS — Z3202 Encounter for pregnancy test, result negative: Secondary | ICD-10-CM

## 2018-05-29 DIAGNOSIS — R300 Dysuria: Secondary | ICD-10-CM | POA: Insufficient documentation

## 2018-05-29 DIAGNOSIS — N390 Urinary tract infection, site not specified: Secondary | ICD-10-CM

## 2018-05-29 LAB — POCT URINALYSIS DIP (DEVICE)
Bilirubin Urine: NEGATIVE
GLUCOSE, UA: NEGATIVE mg/dL
KETONES UR: NEGATIVE mg/dL
Nitrite: NEGATIVE
PROTEIN: NEGATIVE mg/dL
SPECIFIC GRAVITY, URINE: 1.015 (ref 1.005–1.030)
Urobilinogen, UA: 0.2 mg/dL (ref 0.0–1.0)
pH: 6.5 (ref 5.0–8.0)

## 2018-05-29 LAB — POCT PREGNANCY, URINE: Preg Test, Ur: NEGATIVE

## 2018-05-29 MED ORDER — NITROFURANTOIN MONOHYD MACRO 100 MG PO CAPS: 100.0000 mg | ORAL_CAPSULE | Freq: Two times a day (BID) | ORAL | 0 refills | Status: DC

## 2018-05-29 NOTE — ED Triage Notes (Signed)
Pt presents with urinary frequency, pelvic pain and burning with urination.

## 2018-05-29 NOTE — Discharge Instructions (Signed)
We will go ahead and treat you for urinary tract infection I am sending the urine for culture and cytology we will call you with any positive results Follow up as needed for continued or worsening symptoms

## 2018-05-29 NOTE — ED Provider Notes (Signed)
[facility]    CSN: [id number] Arrival date & time: 05/29/18  1138     History   Chief Complaint Chief Complaint  Patient presents with  . Urinary Tract Infection    HPI [name] is a 27 y.o. female.   Patient is a 27 year old female presents for 1 day of urinary frequency, dysuria.  Her symptoms have been constant.  Yesterday her mom gave her a herb to help treat her symptoms.  This returned her urine green.  She is still having symptoms.  She is also having some vaginal discharge without irritation or itching.  She denies any associated fever, chills, abdominal pain, back pain, nausea.  Her last menstrual period was 05/05/2018.  ROS per HPI      Past Medical History:  Diagnosis Date  . Gestational diabetes     Patient Active Problem List   Diagnosis Date Noted  . Normal delivery 09/21/2013  . Normal labor 09/20/2013  . Gestational diabetes 08/16/2013  . Supervision of other normal pregnancy 06/10/2013  . 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 Unspecified vitamin D deficiency 04/25/2013  . H/O preterm delivery, currently pregnant 04/15/2013  . DRUG OVERDOSE 12/21/2007    Past Surgical History:  Procedure Laterality Date  . VAGINAL DELIVERY      OB History    Gravida  2   Para  2   Term  1   Preterm  1   AB      Living  2     SAB      TAB      Ectopic      Multiple      Live Births  2            Home Medications    Prior to Admission medications   Medication Sig Start Date End Date Taking? Authorizing Provider  acetaminophen (TYLENOL) 500 MG tablet Take 500-1,000 mg by mouth every 6 (six) hours as needed for moderate pain, fever or headache.    [provider]  DM-Doxylamine-Acetaminophen (NYQUIL COLD & FLU PO) Take 10 mLs by mouth at bedtime as needed (for flu symptoms).    [provider]  nitrofurantoin, macrocrystal-monohydrate, (MACROBID) 100 MG capsule Take 1 capsule (100 mg total) by mouth 2  (two) times daily. 05/29/18   Janace ArisBast, Latayvia Mandujano A, NP    Family History Family History  Problem Relation Age of Onset  . Hypertension Mother   . Heart disease Neg Hx   . Diabetes Neg Hx   . Cancer Neg Hx     Social History Social History   Tobacco Use  . Smoking status: Never Smoker  . Smokeless tobacco: Never Used  Substance Use Topics  . Alcohol use: Yes  . Drug use: No     Allergies   Shellfish allergy   Review of Systems Review of Systems   Physical Exam Triage Vital Signs ED Triage Vitals [05/29/18 1259]  Enc Vitals Group     BP 134/79     Pulse Rate 96     Resp 20     Temp 99.8 F (37.7 C)     Temp Source Oral     SpO2 100 %     Weight      Height      Head Circumference      Peak Flow      Pain Score 6     Pain Loc      Pain Edu?  Excl. in GC?    No data found.  Updated Vital Signs BP 134/79 (BP Location: Left Arm)   Pulse 96   Temp 99.8 F (37.7 C) (Oral)   Resp 20   LMP 05/05/2018   SpO2 100%   Visual Acuity Right Eye Distance:   Left Eye Distance:   Bilateral Distance:    Right Eye Near:   Left Eye Near:    Bilateral Near:     Physical Exam  Constitutional: She appears well-developed and well-nourished.  HENT:  Head: Normocephalic.  Eyes: Conjunctivae are normal.  Neck: Normal range of motion.  Pulmonary/Chest: Effort normal.  Abdominal: Soft.  Abdomen soft, non tender. No CVA tenderness. No rebound tenderness.   Musculoskeletal: Normal range of motion.  Neurological: She is alert.  Skin: Skin is warm and dry.  Psychiatric: She has a normal mood and affect.  Nursing note and vitals reviewed.    UC Treatments / Results  Labs (all labs ordered are listed, but only abnormal results are displayed) Labs Reviewed  POCT URINALYSIS DIP (DEVICE) - Abnormal; Notable for the following components:      Result Value   Hgb urine dipstick MODERATE (*)    Leukocytes, UA TRACE (*)    All other components within normal limits    URINE CULTURE  POCT PREGNANCY, URINE  URINE CYTOLOGY ANCILLARY ONLY    EKG None  Radiology No results found.  Procedures Procedures (including critical care time)  Medications Ordered in UC Medications - No data to display  Initial Impression / Assessment and Plan / UC Course  I have reviewed the triage vital signs and the nursing notes.  Pertinent labs & imaging results that were available during my care of the patient were reviewed by me and considered in my medical decision making (see chart for details).     Urine showed trace leukocytes with moderate blood We will go ahead and treat for urinary tract infection sent for culture We will also send for cytology Lab results pending we will call with any positive results Final Clinical Impressions(s) / UC Diagnoses   Final diagnoses:  Dysuria     Discharge Instructions     We will go ahead and treat you for urinary tract infection I am sending the urine for culture and cytology we will call you with any positive results Follow up as needed for continued or worsening symptoms     ED Prescriptions    Medication Sig Dispense Auth. Provider   nitrofurantoin, macrocrystal-monohydrate, (MACROBID) 100 MG capsule Take 1 capsule (100 mg total) by mouth 2 (two) times daily. 10 capsule Dahlia Byes A, NP     Controlled Substance Prescriptions [place] Controlled Substance Registry consulted? Not Applicable   Janace Aris, NP 05/29/18 1412

## 2018-05-30 LAB — URINE CULTURE: Culture: NO GROWTH

## 2018-05-31 LAB — URINE CYTOLOGY ANCILLARY ONLY
Chlamydia: NEGATIVE
NEISSERIA GONORRHEA: NEGATIVE
Trichomonas: NEGATIVE

## 2018-06-03 ENCOUNTER — Telehealth (HOSPITAL_COMMUNITY): Payer: Self-pay | Admitting: Emergency Medicine

## 2018-06-03 LAB — URINE CYTOLOGY ANCILLARY ONLY
BACTERIAL VAGINITIS: NEGATIVE
Candida vaginitis: NEGATIVE

## 2018-06-03 MED ORDER — DIFLUCAN 150 MG PO TABS: 150.0000 mg | ORAL_TABLET | Freq: Once | ORAL | 0 refills | Status: AC

## 2018-06-03 NOTE — Telephone Encounter (Signed)
Pt called back and given results. All questions answered.

## 2018-06-03 NOTE — Telephone Encounter (Signed)
Received notification from cytology that there was a contamination in the lab. Test was reran, cytology was NEGATIVE for yeast infection. Attempted to contact patient in regard to not needing medicine called in. Family member answered, will have her call back as soon as possible.

## 2018-06-03 NOTE — Telephone Encounter (Signed)
Test for candida (yeast) was positive.  Prescription for fluconazole 150mg  po now, repeat dose in 3d if needed, #2 no refills, sent to the pharmacy of record.  Recheck or followup with PCP for further evaluation if symptoms are not improving.    Attempted to reach patient, family member picked up and will have her call back as soon as possible

## 2018-11-29 DIAGNOSIS — J189 Pneumonia, unspecified organism: Secondary | ICD-10-CM

## 2018-11-29 DIAGNOSIS — U071 COVID-19: Secondary | ICD-10-CM

## 2018-11-29 HISTORY — DX: Pneumonia, unspecified organism: J18.9

## 2018-11-29 HISTORY — DX: COVID-19: U07.1

## 2018-12-11 ENCOUNTER — Other Ambulatory Visit: Payer: Self-pay

## 2018-12-11 ENCOUNTER — Encounter (HOSPITAL_COMMUNITY): Payer: Self-pay | Admitting: *Deleted

## 2018-12-11 ENCOUNTER — Emergency Department (HOSPITAL_COMMUNITY)
Admission: EM | Admit: 2018-12-11 | Discharge: 2018-12-11 | Disposition: A | Discharge status: Home / Self Care | Payer: Medicaid Other | Attending: Emergency Medicine | Admitting: Emergency Medicine

## 2018-12-11 DIAGNOSIS — R05 Cough: Secondary | ICD-10-CM | POA: Insufficient documentation

## 2018-12-11 DIAGNOSIS — L299 Pruritus, unspecified: Secondary | ICD-10-CM | POA: Insufficient documentation

## 2018-12-11 DIAGNOSIS — T7840XA Allergy, unspecified, initial encounter: Secondary | ICD-10-CM | POA: Insufficient documentation

## 2018-12-11 MED ORDER — METHYLPREDNISOLONE SODIUM SUCC 125 MG IJ SOLR
125.0000 mg | Freq: Once | INTRAMUSCULAR | Status: AC
  Administered 2018-12-11: 125 mg via INTRAMUSCULAR
  Filled 2018-12-11: qty 2

## 2018-12-11 MED ORDER — FAMOTIDINE 20 MG PO TABS
20.0000 mg | ORAL_TABLET | Freq: Once | ORAL | Status: AC
  Administered 2018-12-11: 20 mg via ORAL
  Filled 2018-12-11: qty 1

## 2018-12-11 MED ORDER — EPINEPHRINE 0.3 MG/0.3ML IJ SOAJ: 0.3000 mg | INTRAMUSCULAR | 0 refills | Status: AC | PRN

## 2018-12-11 MED ORDER — DIPHENHYDRAMINE HCL 25 MG PO CAPS
25.0000 mg | ORAL_CAPSULE | Freq: Once | ORAL | Status: AC
  Administered 2018-12-11: 25 mg via ORAL
  Filled 2018-12-11: qty 1

## 2018-12-11 MED ORDER — PREDNISONE 20 MG PO TABS: ORAL_TABLET | ORAL | 0 refills | Status: DC

## 2018-12-11 NOTE — ED Notes (Signed)
Pt verbalized understanding of d/c instructions and has no further questions, VSS, NAD. Airway intact.

## 2018-12-11 NOTE — ED Provider Notes (Signed)
[facility] EMERGENCY DEPARTMENT Provider Note   CSN: [id number] Arrival date & time: 12/11/18  0736    History   Chief Complaint Chief Complaint  Patient presents with  . Allergic Reaction    HPI [name] is a 28 y.o. female.     HPI Patient states she had small amount of strength on Wednesday and started having itching hives as well as lip swelling, scratchy throat and a dry cough.  She denies difficulty breathing.  No difficulty swallowing.  No other detergents, soaps or new medication.  Patient did try drinking ginger lemon tea with little improvement.  States she had difficulty sleeping throughout the night due to the itching.  Has had similar type reaction in the past. Past Medical History:  Diagnosis Date  . Gestational diabetes     Patient Active Problem List   Diagnosis Date Noted  . Normal delivery 09/21/2013  . Normal labor 09/20/2013  . Gestational diabetes 08/16/2013  . Supervision of other normal pregnancy 06/10/2013  . 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 Unspecified vitamin D deficiency 04/25/2013  . H/O preterm delivery, currently pregnant 04/15/2013  . DRUG OVERDOSE 12/21/2007    Past Surgical History:  Procedure Laterality Date  . VAGINAL DELIVERY       OB History    Gravida  2   Para  2   Term  1   Preterm  1   AB      Living  2     SAB      TAB      Ectopic      Multiple      Live Births  2            Home Medications    Prior to Admission medications   Medication Sig Start Date End Date Taking? Authorizing Provider  acetaminophen (TYLENOL) 500 MG tablet Take 500-1,000 mg by mouth every 6 (six) hours as needed for moderate pain, fever or headache.    [provider]  DM-Doxylamine-Acetaminophen (NYQUIL COLD & FLU PO) Take 10 mLs by mouth at bedtime as needed (for flu symptoms).    [provider]  EPINEPHrine 0.3 mg/0.3 mL IJ SOAJ injection Inject 0.3 mLs (0.3 mg total) into  the muscle as needed for anaphylaxis. 12/11/18   Loren RacerYelverton, Sakara Lehtinen, MD  nitrofurantoin, macrocrystal-monohydrate, (MACROBID) 100 MG capsule Take 1 capsule (100 mg total) by mouth 2 (two) times daily. 05/29/18   Janace ArisBast, Traci A, NP  predniSONE (DELTASONE) 20 MG tablet 3 tabs po day one, then 2 po daily x 4 days 12/12/18   Loren RacerYelverton, Cleota Pellerito, MD    Family History Family History  Problem Relation Age of Onset  . Hypertension Mother   . Heart disease Neg Hx   . Diabetes Neg Hx   . Cancer Neg Hx     Social History Social History   Tobacco Use  . Smoking status: Never Smoker  . Smokeless tobacco: Never Used  Substance Use Topics  . Alcohol use: Yes  . Drug use: No     Allergies   Shellfish allergy   Review of Systems Review of Systems  Constitutional: Negative for chills and fever.  HENT: Positive for facial swelling. Negative for trouble swallowing.   Respiratory: Positive for cough. Negative for shortness of breath, wheezing and stridor.   Cardiovascular: Negative for chest pain.  Gastrointestinal: Negative for nausea and vomiting.  Musculoskeletal: Negative for arthralgias, back pain, myalgias and neck pain.  Skin: Positive for rash. Negative for wound.  Neurological: Negative for dizziness, weakness, light-headedness, numbness and headaches.  All other systems reviewed and are negative.    Physical Exam Updated Vital Signs BP (!) 146/56 (BP Location: Right Arm)   Pulse (!) 119   Temp 99.3 F (37.4 C) (Oral)   Resp 19   Ht 4\' 10"  (1.473 m)   Wt 54.4 kg   LMP 12/07/2018   SpO2 97%   BMI 25.08 kg/m   Physical Exam Vitals signs and nursing note reviewed.  Constitutional:      Appearance: She is well-developed.  HENT:     Head: Normocephalic and atraumatic.     Comments: Bilateral upper and lower lip swelling.  Oropharynx is clear.  No tongue swelling.    Mouth/Throat:     Mouth: Mucous membranes are moist.  Eyes:     Extraocular Movements: Extraocular movements  intact.     Pupils: Pupils are equal, round, and reactive to light.  Neck:     Musculoskeletal: Normal range of motion and neck supple. No neck rigidity or muscular tenderness.     Comments: No stridor. Cardiovascular:     Rate and Rhythm: Regular rhythm. Tachycardia present.     Heart sounds: No murmur. No friction rub. No gallop.   Pulmonary:     Effort: Pulmonary effort is normal. No respiratory distress.     Breath sounds: Normal breath sounds. No stridor. No wheezing, rhonchi or rales.  Chest:     Chest wall: No tenderness.  Abdominal:     General: Bowel sounds are normal.     Palpations: Abdomen is soft.     Tenderness: There is no abdominal tenderness. There is no guarding or rebound.  Musculoskeletal: Normal range of motion.        General: No tenderness.  Lymphadenopathy:     Cervical: No cervical adenopathy.  Skin:    General: Skin is warm and dry.     Capillary Refill: Capillary refill takes less than 2 seconds.     Findings: Erythema and rash present.     Comments: Patient with raised erythematous plaques to the extremities and trunk.  Neurological:     Mental Status: She is alert and oriented to person, place, and time.  Psychiatric:        Behavior: Behavior normal.     Comments: Mildly anxious appearing      ED Treatments / Results  Labs (all labs ordered are listed, but only abnormal results are displayed) Labs Reviewed - No data to display  EKG None  Radiology No results found.  Procedures Procedures (including critical care time)  Medications Ordered in ED Medications  methylPREDNISolone sodium succinate (SOLU-MEDROL) 125 mg/2 mL injection 125 mg (125 mg Intramuscular Given 12/11/18 0759)  diphenhydrAMINE (BENADRYL) capsule 25 mg (25 mg Oral Given 12/11/18 0759)  famotidine (PEPCID) tablet 20 mg (20 mg Oral Given 12/11/18 0759)     Initial Impression / Assessment and Plan / ED Course  I have reviewed the triage vital signs and the nursing  notes.  Pertinent labs & imaging results that were available during my care of the patient were reviewed by me and considered in my medical decision making (see chart for details).        Will treat with steroids, Benadryl and Pepcid and observe closely. Patient observed for 3 hours in the emergency department.  Continues to have improvement of facial swelling and rash.  She has no airway compromise.  Will give tapering course of prednisone and prescription for EpiPen.  Strict return precautions have been given. Final Clinical Impressions(s) / ED Diagnoses   Final diagnoses:  Allergic reaction, initial encounter    ED Discharge Orders         Ordered    predniSONE (DELTASONE) 20 MG tablet     12/11/18 1049    EPINEPHrine 0.3 mg/0.3 mL IJ SOAJ injection  As needed     12/11/18 1049           Loren RacerYelverton, Lilley Hubble, MD 12/11/18 1050

## 2018-12-11 NOTE — Discharge Instructions (Addendum)
You may take Benadryl every 6 hours as needed for itching and rash.  Take the tapering dose of prednisone for the next few days.  If you have any difficulty breathing or feel like your throat is closing, use EpiPen as instructed and return immediately to the emergency department.

## 2018-12-11 NOTE — ED Triage Notes (Signed)
Pt states hives, itchy throat and dry cough since Wed after eating shrimp.  Airway patent.  No difficulty breathing.

## 2018-12-17 ENCOUNTER — Emergency Department (HOSPITAL_COMMUNITY)
Admission: EM | Admit: 2018-12-17 | Discharge: 2018-12-17 | Disposition: A | Discharge status: Home / Self Care | Payer: HRSA Program | Attending: Emergency Medicine | Admitting: Emergency Medicine

## 2018-12-17 ENCOUNTER — Other Ambulatory Visit: Payer: Self-pay

## 2018-12-17 ENCOUNTER — Emergency Department (HOSPITAL_COMMUNITY): Payer: HRSA Program

## 2018-12-17 DIAGNOSIS — R509 Fever, unspecified: Secondary | ICD-10-CM | POA: Diagnosis present

## 2018-12-17 DIAGNOSIS — U071 COVID-19: Secondary | ICD-10-CM | POA: Insufficient documentation

## 2018-12-17 DIAGNOSIS — Z79899 Other long term (current) drug therapy: Secondary | ICD-10-CM | POA: Diagnosis not present

## 2018-12-17 DIAGNOSIS — J1289 Other viral pneumonia: Secondary | ICD-10-CM | POA: Diagnosis not present

## 2018-12-17 DIAGNOSIS — J189 Pneumonia, unspecified organism: Secondary | ICD-10-CM

## 2018-12-17 DIAGNOSIS — Z20822 Contact with and (suspected) exposure to covid-19: Secondary | ICD-10-CM

## 2018-12-17 LAB — CBG MONITORING, ED: Glucose-Capillary: 150 mg/dL — ABNORMAL HIGH (ref 70–99)

## 2018-12-17 IMAGING — DX PORTABLE CHEST - 1 VIEW
1 series · 1 of 1 positions shown · non-contrast
Comparison: Chest x-ray [DATE]

CLINICAL DATA: Dry cough and fever since [REDACTED].

EXAM:
PORTABLE CHEST 1 VIEW

[chest ap]
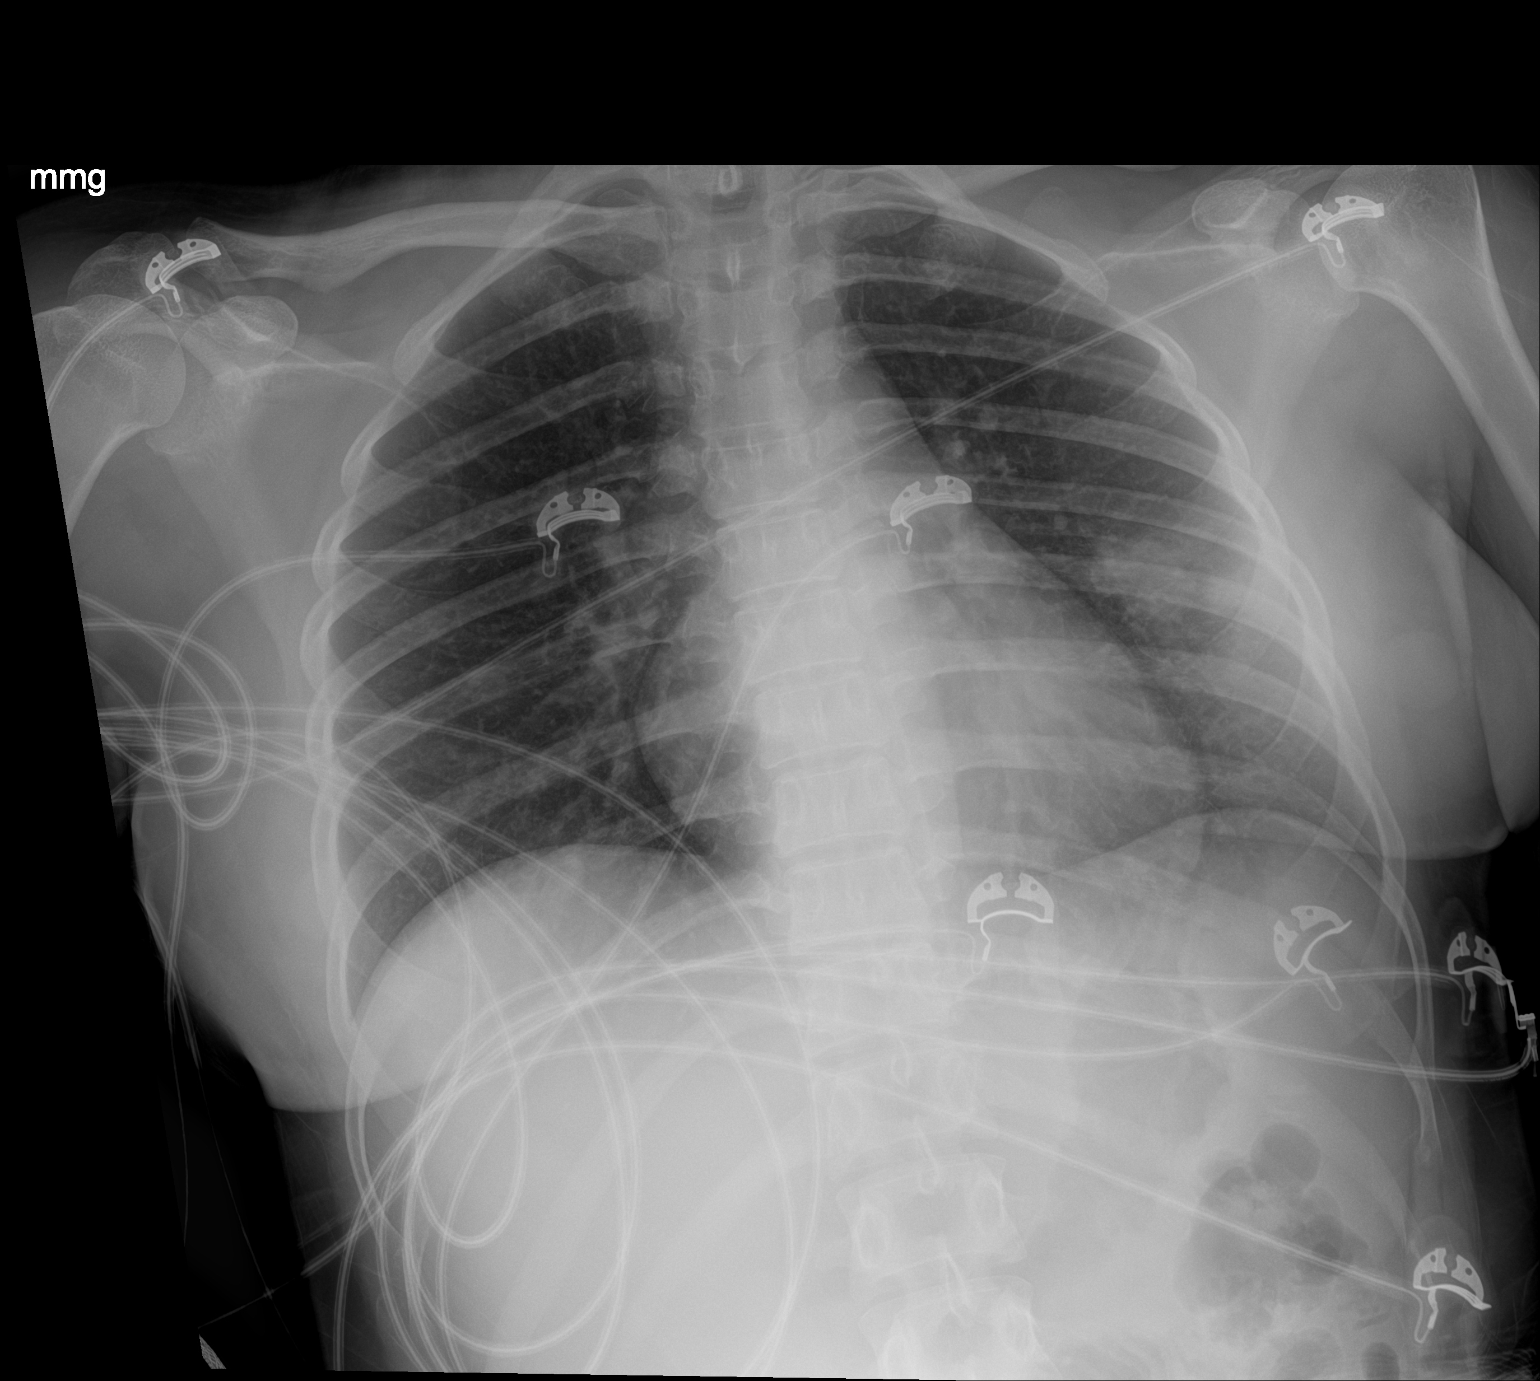

[1 of 1 positions shown; findings below may reference images not displayed]

FINDINGS: The cardiac silhouette, mediastinal and hilar contours are normal.
Patchy airspace opacity in the left lower lung is consistent with
pneumonia. The right lung is clear. No pleural effusions. No
pulmonary lesions.
IMPRESSION: Left lung pneumonia.

## 2018-12-17 MED ORDER — SODIUM CHLORIDE 0.9 % IV BOLUS
1000.0000 mL | Freq: Once | INTRAVENOUS | Status: AC
  Administered 2018-12-17: 1000 mL via INTRAVENOUS

## 2018-12-17 MED ORDER — LEVOFLOXACIN 500 MG PO TABS: 500.0000 mg | ORAL_TABLET | Freq: Every day | ORAL | 0 refills | Status: DC

## 2018-12-17 MED ORDER — MECLIZINE HCL 25 MG PO TABS
25.0000 mg | ORAL_TABLET | Freq: Once | ORAL | Status: AC
  Administered 2018-12-17: 25 mg via ORAL
  Filled 2018-12-17: qty 1

## 2018-12-17 MED ORDER — KETOROLAC TROMETHAMINE 30 MG/ML IJ SOLN
15.0000 mg | Freq: Once | INTRAMUSCULAR | Status: AC
  Administered 2018-12-17: 15 mg via INTRAVENOUS
  Filled 2018-12-17: qty 1

## 2018-12-17 MED ORDER — ACETAMINOPHEN 500 MG PO TABS
1000.0000 mg | ORAL_TABLET | Freq: Once | ORAL | Status: AC
  Administered 2018-12-17: 1000 mg via ORAL
  Filled 2018-12-17: qty 2

## 2018-12-17 MED ORDER — LEVOFLOXACIN IN D5W 500 MG/100ML IV SOLN
500.0000 mg | Freq: Once | INTRAVENOUS | Status: AC
  Administered 2018-12-17: 500 mg via INTRAVENOUS
  Filled 2018-12-17: qty 100

## 2018-12-17 MED ORDER — LEVOFLOXACIN 500 MG PO TABS
500.0000 mg | ORAL_TABLET | Freq: Every day | ORAL | Status: DC
  Administered 2018-12-17: 500 mg via ORAL
  Filled 2018-12-17: qty 1

## 2018-12-17 NOTE — ED Provider Notes (Signed)
Pt signed out by Dr. Vanita Panda pending improvement in sx.  Pt did receive 1L NS as well as IV levaquin.  HR is better.  Pt feels a little better.  Her father has covid and she's been helping him.  I suspect she also has covid.  She is instructed to self-isolate.  She is given covid instructions.  Return if worse.   Isla Pence, MD 12/17/18 [phone]

## 2018-12-17 NOTE — ED Provider Notes (Signed)
[facility] EMERGENCY DEPARTMENT Provider Note   CSN: [id number] Arrival date & time: 12/17/18  1306     History   Chief Complaint Chief Complaint  Patient presents with  . Cough  . Fever    HPI [name] is a 28 y.o. female.     HPI Patient presents almost 1 week after being evaluated here following a food exposure with likely allergy, now with concern for fever, cough, generalized discomfort, nausea. Patient states that she is generally well, takes no medication regularly, and beyond her medication provided last week and no tablets daily. She notes over the past few days she is felt progressively worse, with after mentioned complaints, in spite of trying to stay well-hydrated. No confusion, disorientation, syncope, chest pain.  Patient has a known exposure to a positive COVID patient, her father, within the past week.  Past Medical History:  Diagnosis Date  . Gestational diabetes     Patient Active Problem List   Diagnosis Date Noted  . Normal delivery 09/21/2013  . Normal labor 09/20/2013  . Gestational diabetes 08/16/2013  . Supervision of other normal pregnancy 06/10/2013  . 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 Unspecified vitamin D deficiency 04/25/2013  . H/O preterm delivery, currently pregnant 04/15/2013  . DRUG OVERDOSE 12/21/2007    Past Surgical History:  Procedure Laterality Date  . VAGINAL DELIVERY       OB History    Gravida  2   Para  2   Term  1   Preterm  1   AB      Living  2     SAB      TAB      Ectopic      Multiple      Live Births  2            Home Medications    Prior to Admission medications   Medication Sig Start Date End Date Taking? Authorizing Provider  acetaminophen (TYLENOL) 500 MG tablet Take 500-1,000 mg by mouth every 6 (six) hours as needed for moderate pain, fever or headache.    [provider]  DM-Doxylamine-Acetaminophen (NYQUIL COLD & FLU PO) Take 10 mLs by mouth  at bedtime as needed (for flu symptoms).    [provider]  EPINEPHrine 0.3 mg/0.3 mL IJ SOAJ injection Inject 0.3 mLs (0.3 mg total) into the muscle as needed for anaphylaxis. 12/11/18   Julianne Rice, MD  nitrofurantoin, macrocrystal-monohydrate, (MACROBID) 100 MG capsule Take 1 capsule (100 mg total) by mouth 2 (two) times daily. 05/29/18   Orvan July, NP  predniSONE (DELTASONE) 20 MG tablet 3 tabs po day one, then 2 po daily x 4 days 12/12/18   Julianne Rice, MD    Family History Family History  Problem Relation Age of Onset  . Hypertension Mother   . Heart disease Neg Hx   . Diabetes Neg Hx   . Cancer Neg Hx     Social History Social History   Tobacco Use  . Smoking status: Never Smoker  . Smokeless tobacco: Never Used  Substance Use Topics  . Alcohol use: Yes  . Drug use: No     Allergies   Shellfish allergy   Review of Systems Review of Systems  Constitutional:       Per HPI, otherwise negative  HENT:       Per HPI, otherwise negative  Respiratory:       Per HPI, otherwise negative  Cardiovascular:       Per HPI, otherwise negative  Gastrointestinal: Positive for nausea. Negative for vomiting.  Endocrine:       Negative aside from HPI  Genitourinary:       Neg aside from HPI   Musculoskeletal:       Per HPI, otherwise negative  Skin: Negative.   Neurological: Positive for weakness. Negative for syncope.     Physical Exam Updated Vital Signs BP 110/65   Pulse 95   Temp 100.2 F (37.9 C) (Oral)   Resp (!) 23   Ht 4\' 10"  (1.473 m)   LMP 12/07/2018   SpO2 98%   BMI 25.08 kg/m   Physical Exam Vitals signs and nursing note reviewed.  Constitutional:      General: She is not in acute distress.    Appearance: She is well-developed.  HENT:     Head: Normocephalic and atraumatic.  Eyes:     Conjunctiva/sclera: Conjunctivae normal.  Cardiovascular:     Rate and Rhythm: Regular rhythm. Tachycardia present.  Pulmonary:      Effort: Tachypnea present.     Breath sounds: Normal breath sounds. No stridor.  Abdominal:     General: There is no distension.  Skin:    General: Skin is warm and dry.  Neurological:     Mental Status: She is alert and oriented to person, place, and time.     Cranial Nerves: No cranial nerve deficit.      ED Treatments / Results  Labs (all labs ordered are listed, but only abnormal results are displayed) Labs Reviewed  CBG MONITORING, ED - Abnormal; Notable for the following components:      Result Value   Glucose-Capillary 150 (*)    All other components within normal limits  NOVEL CORONAVIRUS, NAA (HOSPITAL ORDER, SEND-OUT TO REF LAB)    EKG    Radiology Dg Chest Port 1 View  Result Date: 12/17/2018 CLINICAL DATA:  Dry cough and fever since Wednesday. EXAM: PORTABLE CHEST 1 VIEW COMPARISON:  Chest x-ray 01/23/2015 FINDINGS: The cardiac silhouette, mediastinal and hilar contours are normal. Patchy airspace opacity in the left lower lung is consistent with pneumonia. The right lung is clear. No pleural effusions. No pulmonary lesions. IMPRESSION: Left lung pneumonia. Electronically Signed   By: Rudie MeyerP.  Gallerani M.D.   On: 12/17/2018 14:48    Procedures Procedures (including critical care time)  Medications Ordered in ED Medications  sodium chloride 0.9 % bolus 1,000 mL (1,000 mLs Intravenous New Bag/Given 12/17/18 1351)  ketorolac (TORADOL) 30 MG/ML injection 15 mg (15 mg Intravenous Given 12/17/18 1352)     Initial Impression / Assessment and Plan / ED Course  I have reviewed the triage vital signs and the nursing notes.  Pertinent labs & imaging results that were available during my care of the patient were reviewed by me and considered in my medical decision making (see chart for details).    Initial findings concerning for left focal pneumonia Given the patient's tachycardia, tachypnea, she required additional monitoring, fluids, Levaquin provided.     This  pleasant young female presents with generalized complaints including cough, fever.  Patient does have COVID exposure, and COVID test sent. However, patient found to have focal left-sided pneumonia with x-ray inconsistent with typical coronavirus infection. Patient started antibiotics, required fluids, symptomatic control, will require additional reassessment and disposition as appropriate. Dr. Particia NearingHaviland is aware of the patient's presentation.  Derrill CenterLan H Mincer was evaluated in Emergency Department on 12/17/2018 for  the symptoms described in the history of present illness. She was evaluated in the context of the global COVID-19 pandemic, which necessitated consideration that the patient might be at risk for infection with the SARS-CoV-2 virus that causes COVID-19. Institutional protocols and algorithms that pertain to the evaluation of patients at risk for COVID-19 are in a state of rapid change based on information released by regulatory bodies including the CDC and federal and state organizations. These policies and algorithms were followed during the patient's care in the ED.   Final Clinical Impressions(s) / ED Diagnoses   Final diagnoses:  Community acquired pneumonia of left lower lobe of lung (HCC)     Gerhard MunchLockwood, Gad Aymond, MD 12/17/18 1458

## 2018-12-17 NOTE — ED Triage Notes (Addendum)
Pt here for evaluation of dry cough and fever since Wednesday. Endorses back pain when coughing. Took tylenol at 0900 today. Pt's dad is covid + and she has delivered food to his house.

## 2018-12-23 LAB — NOVEL CORONAVIRUS, NAA (HOSP ORDER, SEND-OUT TO REF LAB; TAT 18-24 HRS): SARS-CoV-2, NAA: DETECTED — AB

## 2018-12-28 NOTE — Progress Notes (Signed)
Patient ID: [name], female   DOB: [birth year], 28 y.o.   MRN: [id number]  Virtual Visit via Telephone Note  I connected with [name] on 12/29/18 at 10:50 AM EDT by telephone and verified that I am speaking with the correct person using two identifiers.   I discussed the limitations, risks, security and privacy concerns of performing an evaluation and management service by telephone and the availability of in person appointments. I also discussed with the patient that there may be a patient responsible charge related to this service. The patient expressed understanding and agreed to proceed.  Patient location:  home My Location:  [facility] office Persons on the call: me and patient.     History of Present Illness: After being seen in the ED 12/17/2018 and tested + for Covid-19.  Appetite is good. Some congestion. No cough.  She is feeling pretty much back to normal.    Had gestational diabetes but no problems when not pregnant.  Admits to eating a lot of rice.  Denies s/sx of hyperglycemia  HPI Patient presents almost 1 week after being evaluated here following a food exposure with likely allergy, now with concern for fever, cough, generalized discomfort, nausea. Patient states that she is generally well, takes no medication regularly, and beyond her medication provided last week and no tablets daily. She notes over the past few days she is felt progressively worse, with after mentioned complaints, in spite of trying to stay well-hydrated. No confusion, disorientation, syncope, chest pain.  Patient has a known exposure to a positive COVID patient, her father, within the past week.  From A/P: Initial findings concerning for left focal pneumonia Given the patient's tachycardia, tachypnea, she required additional monitoring, fluids, Levaquin provided.     This pleasant young female presents with generalized complaints including cough, fever.  Patient does have COVID exposure, and COVID test  sent. However, patient found to have focal left-sided pneumonia with x-ray inconsistent with typical coronavirus infection. Patient started antibiotics, required fluids, symptomatic control, will require additional reassessment and disposition as appropriate. Dr. Gilford Raid is aware of the patient's presentation.  [name] was evaluated in Emergency Department on 12/17/2018 for the symptoms described in the history of present illness. She was evaluated in the context of the global COVID-19 pandemic, which necessitated consideration that the patient might be at risk for infection with the SARS-CoV-2 virus that causes COVID-19. Institutional protocols and algorithms that pertain to the evaluation of patients at risk for COVID-19 are in a state of rapid change based on information released by regulatory bodies including the CDC and federal and state organizations. These policies and algorithms were followed during the patient's care in the ED.   Observations/Objective:  A&Ox3   Assessment and Plan:  1. Hyperglycemia with h/o gestational diabetes.   I have had a lengthy discussion and provided education about insulin resistance and the intake of too much sugar/refined carbohydrates.  I have advised the patient to work at a goal of eliminating sugary drinks, candy, desserts, sweets, refined sugars, processed foods, and white carbohydrates.  The patient expresses understanding.  Will check A1C at establishing care visit   2. COVID-19 virus detected Much improved  3. Encounter for examination following treatment at hospital Much improved   Follow Up Instructions: 02/01/2019 for establish care and f/up bloodwork   I discussed the assessment and treatment plan with the patient. The patient was provided an opportunity to ask questions and all were answered. The patient agreed  with the plan and demonstrated an understanding of the instructions.   The patient was advised to call back or seek an  in-person evaluation if the symptoms worsen or if the condition fails to improve as anticipated.  I provided 11 minutes of non-face-to-face time during this encounter.   Georgian CoAngela McClung, PA-C

## 2018-12-29 ENCOUNTER — Ambulatory Visit: Payer: HRSA Program | Attending: Family Medicine | Admitting: Physician Assistant

## 2018-12-29 ENCOUNTER — Other Ambulatory Visit: Payer: Self-pay

## 2018-12-29 DIAGNOSIS — Z09 Encounter for follow-up examination after completed treatment for conditions other than malignant neoplasm: Secondary | ICD-10-CM

## 2018-12-29 DIAGNOSIS — R739 Hyperglycemia, unspecified: Secondary | ICD-10-CM | POA: Diagnosis not present

## 2018-12-29 DIAGNOSIS — U071 COVID-19: Secondary | ICD-10-CM

## 2018-12-29 NOTE — Progress Notes (Signed)
Patient verified DOB Patient has not taken medication. Patient has not eaten today. Patient cough is intermittent.

## 2019-02-01 ENCOUNTER — Ambulatory Visit: Payer: Medicaid Other | Admitting: Nurse Practitioner

## 2019-02-22 ENCOUNTER — Ambulatory Visit: Payer: Self-pay | Attending: Nurse Practitioner | Admitting: Nurse Practitioner

## 2019-02-22 ENCOUNTER — Encounter: Payer: Self-pay | Admitting: Nurse Practitioner

## 2019-02-22 ENCOUNTER — Other Ambulatory Visit: Payer: Self-pay

## 2019-02-22 DIAGNOSIS — R519 Headache, unspecified: Secondary | ICD-10-CM

## 2019-02-22 DIAGNOSIS — D582 Other hemoglobinopathies: Secondary | ICD-10-CM

## 2019-02-22 DIAGNOSIS — Z7689 Persons encountering health services in other specified circumstances: Secondary | ICD-10-CM

## 2019-02-22 DIAGNOSIS — H9313 Tinnitus, bilateral: Secondary | ICD-10-CM

## 2019-02-22 NOTE — Progress Notes (Signed)
Virtual Visit via Telephone Note Due to national recommendations of social distancing due to [place] 19, telehealth visit is felt to be most appropriate for this patient at this time.  I discussed the limitations, risks, security and privacy concerns of performing an evaluation and management service by telephone and the availability of in person appointments. I also discussed with the patient that there may be a patient responsible charge related to this service. The patient expressed understanding and agreed to proceed.    I connected with [name] on 02/22/19  at   2:50 PM EDT  EDT by telephone and verified that I am speaking with the correct person using two identifiers.   Consent I discussed the limitations, risks, security and privacy concerns of performing an evaluation and management service by telephone and the availability of in person appointments. I also discussed with the patient that there may be a patient responsible charge related to this service. The patient expressed understanding and agreed to proceed.   Location of Patient: Private Residence   Location of Provider: [place] and [employer] Office    Persons participating in Telemedicine visit: Geryl Rankins FNP-BC [place]    History of Present Illness: Telemedicine visit for: Establish Care  has a past medical history of CAP (community acquired pneumonia) (11/2018), COVID-19 (11/2018), and Gestational diabetes.  She has a history of gestational diabetes. It has been 6 years since her last PAP. Will need to schedule for PAP smear and blood work. She denies any PMH of HTN, HPL, Thyroid disease.  Denies chest pain, shortness of breath, palpitations, lightheadedness, dizziness, headaches or BLE edema. She denies any symptoms of hypo or hyperglycemia. She does endorse tinnitus with intermittent headaches. A review of previous blood pressures does not reveal HTN. She also denies any history of  allergies. Tinnitus is mild and infrequent.  BP Readings from Last 3 Encounters:  12/17/18 121/77  12/11/18 106/64  05/29/18 134/79     Past Medical History:  Diagnosis Date  . CAP (community acquired pneumonia) 11/2018  . COVID-19 11/2018  . Gestational diabetes     Past Surgical History:  Procedure Laterality Date  . VAGINAL DELIVERY      Family History  Problem Relation Age of Onset  . Hypertension Mother   . Diabetes Mother   . Heart disease Neg Hx   . Cancer Neg Hx     Social History   Socioeconomic History  . Marital status: Single    Spouse name: Not on file  . Number of children: Not on file  . Years of education: Not on file  . Highest education level: Not on file  Occupational History  . Not on file  Social Needs  . Financial resource strain: Not on file  . Food insecurity    Worry: Not on file    Inability: Not on file  . Transportation needs    Medical: Not on file    Non-medical: Not on file  Tobacco Use  . Smoking status: Never Smoker  . Smokeless tobacco: Never Used  Substance and Sexual Activity  . Alcohol use: Not Currently  . Drug use: No  . Sexual activity: Yes    Birth control/protection: None  Lifestyle  . Physical activity    Days per week: Not on file    Minutes per session: Not on file  . Stress: Not on file  Relationships  . Social Herbalist on phone: Not  on file    Gets together: Not on file    Attends religious service: Not on file    Active member of club or organization: Not on file    Attends meetings of clubs or organizations: Not on file    Relationship status: Not on file  Other Topics Concern  . Not on file  Social History Narrative  . Not on file     Observations/Objective: Awake, alert and oriented x 3   Review of Systems  Constitutional: Negative for fever, malaise/fatigue and weight loss.  HENT: Positive for tinnitus. Negative for nosebleeds.   Eyes: Negative.  Negative for blurred vision,  double vision and photophobia.  Respiratory: Negative.  Negative for cough and shortness of breath.   Cardiovascular: Negative.  Negative for chest pain, palpitations and leg swelling.  Gastrointestinal: Negative.  Negative for heartburn, nausea and vomiting.  Musculoskeletal: Negative.  Negative for myalgias.  Neurological: Positive for headaches. Negative for dizziness, focal weakness and seizures.  Psychiatric/Behavioral: Negative.  Negative for suicidal ideas.    Assessment and Plan: Diagnoses and all orders for this visit:  Encounter to establish care CBC BMP LIPID A1C pending  Tinnitus of both ears Continue to monitor Symptoms are infrequent and tolerable at this time.    Intermittent headache Continue to monitor Symptoms are infrequent and tolerable at this time.   Hemoglobin E trait (HCC) CBC pending    Follow Up Instructions Return for PAP SMEAR.     I discussed the assessment and treatment plan with the patient. The patient was provided an opportunity to ask questions and all were answered. The patient agreed with the plan and demonstrated an understanding of the instructions.   The patient was advised to call back or seek an in-person evaluation if the symptoms worsen or if the condition fails to improve as anticipated.  I provided 18 minutes of non-face-to-face time during this encounter including median intraservice time, reviewing previous notes, labs, imaging, medications and explaining diagnosis and management.  Claiborne RiggZelda W Fleming, FNP-BC

## 2019-02-24 ENCOUNTER — Other Ambulatory Visit: Payer: Self-pay

## 2019-02-24 ENCOUNTER — Other Ambulatory Visit: Payer: Medicaid Other

## 2019-02-24 DIAGNOSIS — Z131 Encounter for screening for diabetes mellitus: Secondary | ICD-10-CM

## 2019-02-24 DIAGNOSIS — Z0000 Encounter for general adult medical examination without abnormal findings: Secondary | ICD-10-CM

## 2019-02-27 ENCOUNTER — Encounter: Payer: Self-pay | Admitting: Nurse Practitioner

## 2019-04-04 ENCOUNTER — Other Ambulatory Visit: Payer: Medicaid Other | Admitting: Nurse Practitioner

## 2019-04-21 ENCOUNTER — Ambulatory Visit (HOSPITAL_COMMUNITY)
Admission: EM | Admit: 2019-04-21 | Discharge: 2019-04-21 | Disposition: A | Discharge status: Home / Self Care | Payer: Self-pay | Attending: Family Medicine | Admitting: Family Medicine

## 2019-04-21 ENCOUNTER — Other Ambulatory Visit: Payer: Self-pay

## 2019-04-21 ENCOUNTER — Encounter (HOSPITAL_COMMUNITY): Payer: Self-pay

## 2019-04-21 DIAGNOSIS — H6982 Other specified disorders of Eustachian tube, left ear: Secondary | ICD-10-CM

## 2019-04-21 DIAGNOSIS — H8111 Benign paroxysmal vertigo, right ear: Secondary | ICD-10-CM

## 2019-04-21 DIAGNOSIS — R42 Dizziness and giddiness: Secondary | ICD-10-CM

## 2019-04-21 LAB — CBC
HCT: 35.7 % — ABNORMAL LOW (ref 36.0–46.0)
Hemoglobin: 12.9 g/dL (ref 12.0–15.0)
MCH: 26.8 pg (ref 26.0–34.0)
MCHC: 36.1 g/dL — ABNORMAL HIGH (ref 30.0–36.0)
MCV: 74.2 fL — ABNORMAL LOW (ref 80.0–100.0)
Platelets: 301 10*3/uL (ref 150–400)
RBC: 4.81 MIL/uL (ref 3.87–5.11)
RDW: 12.7 % (ref 11.5–15.5)
WBC: 8.4 10*3/uL (ref 4.0–10.5)
nRBC: 0 % (ref 0.0–0.2)

## 2019-04-21 LAB — BASIC METABOLIC PANEL
Anion gap: 10 (ref 5–15)
BUN: 7 mg/dL (ref 6–20)
CO2: 22 mmol/L (ref 22–32)
Calcium: 9.2 mg/dL (ref 8.9–10.3)
Chloride: 105 mmol/L (ref 98–111)
Creatinine, Ser: 0.6 mg/dL (ref 0.44–1.00)
GFR calc Af Amer: >60 mL/min (ref >60)
GFR calc non Af Amer: >60 mL/min (ref >60)
Glucose, Bld: 79 mg/dL (ref 70–99)
Potassium: 3.9 mmol/L (ref 3.5–5.1)
Sodium: 137 mmol/L (ref 135–145)

## 2019-04-21 LAB — GLUCOSE, CAPILLARY: Glucose-Capillary: 82 mg/dL (ref 70–99)

## 2019-04-21 MED ORDER — FLUTICASONE PROPIONATE 50 MCG/ACT NA SUSP: 1.0000 | Freq: Every day | NASAL | 0 refills | Status: AC

## 2019-04-21 MED ORDER — MECLIZINE HCL 12.5 MG PO TABS: 12.5000 mg | ORAL_TABLET | Freq: Three times a day (TID) | ORAL | 0 refills | Status: AC | PRN

## 2019-04-21 NOTE — ED Provider Notes (Signed)
[facility]    CSN: [id number] Arrival date & time: 04/21/19  9935      History   Chief Complaint Chief Complaint  Patient presents with   Appointment   Otalgia    HPI [name] is a 28 y.o. female no significant past medical history presenting today for evaluation of dizziness and left ear discomfort.  Patient states that over the past month she has had intermittent sensations of dizziness.  Occasionally she will have sensations of feeling lightheaded with changing position, denies syncope.  Occasionally she will have sensations of spinning.  She notices the spinning sensation especially with turning head.  Occasionally will have a nauseous sensation, denies vomiting.  Denies vision changes.  Denies headache.  She notes over the past week she has had intermittent sensations of ringing in her left ear as well as occasional pain.  She is concerned about possible ear infection.  Denies congestion sore throat or cough.  Notes that she does the positive for Covid in June and had mild fevers then, felt weak for approximately a week afterward, but symptoms fully resolved prior to onset of current symptoms.  Denies chest pain or shortness of breath.  Denies leg pain or leg swelling.  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 Authorizing Provider  EPINEPHrine 0.3 mg/0.3 mL IJ SOAJ injection Inject 0.3 mLs (0.3 mg total) into the muscle as needed for anaphylaxis. Patient not taking: Reported on 02/22/2019 12/11/18   Julianne Rice, MD  fluticasone [facility]) 50 MCG/ACT nasal spray Place 1-2 sprays into both nostrils daily for 14 days. 04/21/19 05/05/19  Cieanna Stormes C, PA-C  meclizine (ANTIVERT) 12.5 MG tablet Take 1 tablet (12.5 mg total) by mouth 3 (three) times daily as needed for dizziness. 04/21/19   Lorain Keast, Elesa Hacker, PA-C    Family History Family History  Problem Relation Age of Onset   Hypertension Mother    Diabetes Mother    Heart disease Neg Hx    Cancer Neg Hx     Social History Social History   Tobacco Use   Smoking status: Never Smoker   Smokeless tobacco: Never Used  Substance Use Topics   Alcohol use: Not Currently   Drug use: No     Allergies   Shellfish allergy   Review of Systems Review of Systems  Constitutional: Negative for fatigue and fever.  HENT: Positive for ear pain. Negative for congestion, sinus pressure and sore throat.   Eyes: Negative for photophobia, pain and visual disturbance.  Respiratory: Negative for cough and shortness of breath.   Cardiovascular: Negative for chest pain.  Gastrointestinal: Negative for abdominal pain, nausea and vomiting.  Genitourinary: Negative for decreased urine volume and hematuria.  Musculoskeletal: Negative for myalgias, neck pain and neck stiffness.  Neurological: Positive for dizziness and light-headedness. Negative for syncope, facial asymmetry, speech difficulty, weakness, numbness and headaches.     Physical Exam Triage Vital Signs ED Triage Vitals  Enc Vitals Group     BP 04/21/19 1012 126/78     Pulse Rate 04/21/19 1012 86     Resp 04/21/19  1012 17     Temp 04/21/19 1012 97.8 F (36.6 C)     Temp Source 04/21/19 1012 Tympanic     SpO2 04/21/19 1012 100 %     Weight 04/21/19 1017 120 lb (54.4 kg)     Height --      Head Circumference --      Peak Flow --      Pain Score 04/21/19 1017 6     Pain Loc --      Pain Edu? --      Excl. in GC? --    Orthostatic VS for the past 24 hrs:  BP- Lying Pulse- Lying BP- Sitting Pulse- Sitting BP- Standing at 0 minutes Pulse- Standing at 0 minutes  04/21/19 1058 113/76 80 121/80 81 120/82 83    Updated Vital Signs BP 126/78 (BP Location: Left Arm)    Pulse 86    Temp 97.8 F (36.6 C) (Tympanic)    Resp 17    Wt 120 lb (54.4 kg)    LMP 04/09/2019    SpO2 100%    BMI 25.08 kg/m   Visual Acuity Right Eye Distance:   Left Eye Distance:   Bilateral Distance:    Right Eye Near:   Left Eye Near:    Bilateral Near:     Physical Exam Vitals signs and nursing note reviewed.  Constitutional:      General: She is not in acute distress.    Appearance: She is well-developed.  HENT:     Head: Normocephalic and atraumatic.     Ears:     Comments: Bilateral ears without tenderness to palpation of external auricle, tragus and mastoid, EAC's without erythema or swelling, TM's with good bony landmarks and cone of light. Non erythematous.     Nose:     Comments: Bilateral nasal mucosa erythematous with swollen turbinates bilaterally    Mouth/Throat:     Comments: Oral mucosa pink and moist, no tonsillar enlargement or exudate. Posterior pharynx patent and nonerythematous, no uvula deviation or swelling. Normal phonation. Palate elevates symmetrically  Eyes:     Extraocular Movements: Extraocular movements intact.     Conjunctiva/sclera: Conjunctivae normal.     Pupils: Pupils are equal, round, and reactive to light.  Neck:     Musculoskeletal: Neck supple.  Cardiovascular:     Rate and Rhythm: Normal rate and regular rhythm.     Heart sounds: No murmur.  Pulmonary:     Effort:  Pulmonary effort is normal. No respiratory distress.     Breath sounds: Normal breath sounds.     Comments: Breathing comfortably at rest, CTABL, no wheezing, rales or other adventitious sounds auscultated Abdominal:     Palpations: Abdomen is soft.     Tenderness: There  is no abdominal tenderness.  Skin:    General: Skin is warm and dry.  Neurological:     General: No focal deficit present.     Mental Status: She is alert and oriented to person, place, and time. Mental status is at baseline.     Comments: Patient A&O x3, cranial nerves II-XII grossly intact, strength at shoulders, hips and knees 5/5, equal bilaterally, patellar reflex 2+ bilaterally.  Negative Romberg and Pronator Drift. Gait without abnormality.  Weakly positive Dix-Hallpike towards right, developed slight spinning sensation, but no nystagmus noted      UC Treatments / Results  Labs (all labs ordered are listed, but only abnormal results are displayed) Labs Reviewed  GLUCOSE, CAPILLARY  CBC  BASIC METABOLIC PANEL  CBG MONITORING, ED    EKG   Radiology No results found.  Procedures Procedures (including critical care time)  Medications Ordered in UC Medications - No data to display  Initial Impression / Assessment and Plan / UC Course  I have reviewed the triage vital signs and the nursing notes.  Pertinent labs & imaging results that were available during my care of the patient were reviewed by me and considered in my medical decision making (see chart for details).     Negative orthostatics, CBC and BMP pending.  Will call with results and alter plan as needed.  Given inflamed turbinates and associated left ear discomfort will treat for eustachian tube dysfunction with Flonase nasal spray over the next 2 weeks.  We will also treat for BPPV with meclizine and Epley maneuver.  No neuro deficits on exam.  Continue to monitor, follow-up with primary care for further work-up and evaluation if symptoms  persisting.  Follow-up in emergency room if symptoms progressing or worsening, developing vision changes, difficulty speaking, weakness, associated headaches. Discussed strict return precautions. Patient verbalized understanding and is agreeable with plan.   Final Clinical Impressions(s) / UC Diagnoses   Final diagnoses:  Dizziness  Benign paroxysmal positional vertigo of right ear  Dysfunction of left eustachian tube     Discharge Instructions     Please use Flonase nasal spray 1 to 2 spray in each nostril daily for the next 2 weeks. May use meclizine as needed for dizziness and nausea, but this will cause drowsiness, do not drive or work after taking Please try performing Epley maneuver 2-3 times a day, may use attached instructions or look on YouTube- Epley Maneuver, Log Roll/ BBQ maneuver  I will be in contact about your blood work  Please follow-up with primary care if symptoms persisting Please follow-up here emergency room if developing worsening dizziness, lightheadedness, vision changes, difficulty speaking, weakness, sensation of off balance, chest pain or shortness of breath    ED Prescriptions    Medication Sig Dispense Auth. Provider   fluticasone (FLONASE) 50 MCG/ACT nasal spray Place 1-2 sprays into both nostrils daily for 14 days. 16 g Lexy Meininger C, PA-C   meclizine (ANTIVERT) 12.5 MG tablet Take 1 tablet (12.5 mg total) by mouth 3 (three) times daily as needed for dizziness. 30 tablet Eliya Geiman, HaroldHallie C, PA-C     PDMP not reviewed this encounter.   Lew DawesWieters, Ladarrius Bogdanski C, PA-C 04/21/19 1152

## 2019-04-21 NOTE — Discharge Instructions (Signed)
Please use Flonase nasal spray 1 to 2 spray in each nostril daily for the next 2 weeks. May use meclizine as needed for dizziness and nausea, but this will cause drowsiness, do not drive or work after taking Please try performing Epley maneuver 2-3 times a day, may use attached instructions or look on YouTube- Epley Maneuver, Log Roll/ BBQ maneuver  I will be in contact about your blood work  Please follow-up with primary care if symptoms persisting Please follow-up here emergency room if developing worsening dizziness, lightheadedness, vision changes, difficulty speaking, weakness, sensation of off balance, chest pain or shortness of breath

## 2019-04-21 NOTE — ED Triage Notes (Signed)
Pt states she has left ear discomfort x 1 week and the dizziness she has had a month.
